# Patient Record
Sex: Female | Born: 1980 | Race: White | Hispanic: No | Marital: Married | Smoking: Current every day smoker
Health system: Southern US, Community
[De-identification: ages and names within clinical notes are randomized; demographics above are authoritative.]

## PROBLEM LIST (undated history)

## (undated) DIAGNOSIS — B9689 Other specified bacterial agents as the cause of diseases classified elsewhere: Secondary | ICD-10-CM

## (undated) DIAGNOSIS — L0291 Cutaneous abscess, unspecified: Secondary | ICD-10-CM

## (undated) DIAGNOSIS — J018 Other acute sinusitis: Secondary | ICD-10-CM

## (undated) DIAGNOSIS — A6000 Herpesviral infection of urogenital system, unspecified: Secondary | ICD-10-CM

## (undated) DIAGNOSIS — N912 Amenorrhea, unspecified: Secondary | ICD-10-CM

## (undated) DIAGNOSIS — Z13220 Encounter for screening for lipoid disorders: Secondary | ICD-10-CM

## (undated) DIAGNOSIS — K602 Anal fissure, unspecified: Secondary | ICD-10-CM

## (undated) DIAGNOSIS — J209 Acute bronchitis, unspecified: Secondary | ICD-10-CM

## (undated) DIAGNOSIS — R1310 Dysphagia, unspecified: Secondary | ICD-10-CM

## (undated) DIAGNOSIS — N760 Acute vaginitis: Secondary | ICD-10-CM

## (undated) DIAGNOSIS — F17200 Nicotine dependence, unspecified, uncomplicated: Secondary | ICD-10-CM

## (undated) DIAGNOSIS — L408 Other psoriasis: Secondary | ICD-10-CM

## (undated) DIAGNOSIS — L0390 Cellulitis, unspecified: Secondary | ICD-10-CM

## (undated) HISTORY — DX: Amenorrhea, unspecified: N91.2

## (undated) HISTORY — DX: Dysphagia, unspecified: R13.10

## (undated) HISTORY — DX: Anal fissure, unspecified: K60.2

## (undated) HISTORY — DX: Encounter for screening for lipoid disorders: Z13.220

## (undated) HISTORY — DX: Herpesviral infection of urogenital system, unspecified: A60.00

## (undated) HISTORY — DX: Other psoriasis: L40.8

## (undated) HISTORY — DX: Cutaneous abscess, unspecified: L02.91

## (undated) HISTORY — DX: Acute bronchitis, unspecified: J20.9

## (undated) HISTORY — DX: Other acute sinusitis: J01.80

## (undated) HISTORY — DX: Nicotine dependence, unspecified, uncomplicated: F17.200

## (undated) HISTORY — DX: Other specified bacterial agents as the cause of diseases classified elsewhere: B96.89

## (undated) HISTORY — DX: Cutaneous abscess, unspecified: L03.90

## (undated) HISTORY — DX: Other specified bacterial agents as the cause of diseases classified elsewhere: N76.0

---

## 2007-10-28 ENCOUNTER — Emergency Department (HOSPITAL_COMMUNITY): Admission: EM | Admit: 2007-10-28 | Discharge: 2007-10-28 | Payer: Self-pay | Admitting: Emergency Medicine

## 2008-01-01 ENCOUNTER — Emergency Department: Payer: Self-pay | Admitting: Internal Medicine

## 2009-06-05 LAB — HM PAP SMEAR

## 2009-06-05 LAB — CONVERTED CEMR LAB: Pap Smear: NORMAL

## 2009-10-24 ENCOUNTER — Ambulatory Visit: Payer: Self-pay | Admitting: Family Medicine

## 2009-10-24 DIAGNOSIS — K602 Anal fissure, unspecified: Secondary | ICD-10-CM

## 2009-10-24 DIAGNOSIS — L408 Other psoriasis: Secondary | ICD-10-CM

## 2009-11-03 ENCOUNTER — Telehealth: Payer: Self-pay | Admitting: Family Medicine

## 2009-11-18 ENCOUNTER — Ambulatory Visit: Payer: Self-pay | Admitting: Family Medicine

## 2009-11-19 LAB — CONVERTED CEMR LAB
BUN: 13 mg/dL (ref 6–23)
Chloride: 106 meq/L (ref 96–112)
Cholesterol: 157 mg/dL (ref 0–200)
Creatinine, Ser: 0.8 mg/dL (ref 0.4–1.2)
GFR calc non Af Amer: 90.26 mL/min (ref >60)
LDL Cholesterol: 77 mg/dL (ref 0–99)
Total CHOL/HDL Ratio: 2
Triglycerides: 75 mg/dL (ref 0.0–149.0)
VLDL: 15 mg/dL (ref 0.0–40.0)

## 2009-11-20 ENCOUNTER — Telehealth (INDEPENDENT_AMBULATORY_CARE_PROVIDER_SITE_OTHER): Payer: Self-pay | Admitting: *Deleted

## 2009-12-05 ENCOUNTER — Telehealth: Payer: Self-pay | Admitting: Family Medicine

## 2009-12-10 ENCOUNTER — Encounter: Payer: Self-pay | Admitting: Family Medicine

## 2009-12-10 ENCOUNTER — Ambulatory Visit: Payer: Self-pay | Admitting: Family Medicine

## 2009-12-10 LAB — CONVERTED CEMR LAB
Nitrite: POSITIVE
Protein, U semiquant: 30
Urobilinogen, UA: 0.2
pH: 6

## 2009-12-15 ENCOUNTER — Telehealth: Payer: Self-pay | Admitting: Family Medicine

## 2010-01-05 ENCOUNTER — Telehealth: Payer: Self-pay | Admitting: Family Medicine

## 2010-01-06 ENCOUNTER — Telehealth: Payer: Self-pay | Admitting: Family Medicine

## 2010-01-16 ENCOUNTER — Ambulatory Visit: Payer: Self-pay | Admitting: Family Medicine

## 2010-01-16 DIAGNOSIS — A6000 Herpesviral infection of urogenital system, unspecified: Secondary | ICD-10-CM | POA: Insufficient documentation

## 2010-01-16 DIAGNOSIS — L0390 Cellulitis, unspecified: Secondary | ICD-10-CM

## 2010-01-16 DIAGNOSIS — L0291 Cutaneous abscess, unspecified: Secondary | ICD-10-CM

## 2010-01-19 ENCOUNTER — Telehealth: Payer: Self-pay | Admitting: Family Medicine

## 2010-04-17 ENCOUNTER — Ambulatory Visit: Payer: Self-pay | Admitting: Family Medicine

## 2010-04-17 DIAGNOSIS — N912 Amenorrhea, unspecified: Secondary | ICD-10-CM

## 2010-04-17 DIAGNOSIS — R1310 Dysphagia, unspecified: Secondary | ICD-10-CM | POA: Insufficient documentation

## 2010-04-17 LAB — CONVERTED CEMR LAB: Beta hcg, urine, semiquantitative: NEGATIVE

## 2010-04-21 ENCOUNTER — Encounter: Payer: Self-pay | Admitting: Family Medicine

## 2010-04-22 ENCOUNTER — Telehealth: Payer: Self-pay | Admitting: Family Medicine

## 2010-06-11 ENCOUNTER — Ambulatory Visit: Payer: Self-pay | Admitting: Family Medicine

## 2010-06-11 DIAGNOSIS — F17200 Nicotine dependence, unspecified, uncomplicated: Secondary | ICD-10-CM

## 2010-09-04 ENCOUNTER — Ambulatory Visit: Payer: Self-pay | Admitting: Family Medicine

## 2010-09-04 LAB — CONVERTED CEMR LAB
Blood in Urine, dipstick: NEGATIVE
Ketones, urine, test strip: NEGATIVE
Nitrite: NEGATIVE
Specific Gravity, Urine: 1.01
Urobilinogen, UA: 0.2
WBC Urine, dipstick: NEGATIVE

## 2010-11-05 ENCOUNTER — Ambulatory Visit: Payer: Self-pay | Admitting: Family Medicine

## 2010-11-05 LAB — CONVERTED CEMR LAB
Bilirubin Urine: NEGATIVE
Blood in Urine, dipstick: NEGATIVE
Glucose, Urine, Semiquant: NEGATIVE
Ketones, urine, test strip: NEGATIVE
Urobilinogen, UA: 0.2
Whiff Test: NEGATIVE
pH: 5.5

## 2010-11-06 ENCOUNTER — Encounter: Payer: Self-pay | Admitting: Family Medicine

## 2010-11-27 ENCOUNTER — Ambulatory Visit: Payer: Self-pay | Admitting: Internal Medicine

## 2010-11-27 DIAGNOSIS — J209 Acute bronchitis, unspecified: Secondary | ICD-10-CM

## 2011-01-15 ENCOUNTER — Ambulatory Visit
Admission: RE | Admit: 2011-01-15 | Discharge: 2011-01-15 | Payer: Self-pay | Source: Home / Self Care | Attending: Family Medicine | Admitting: Family Medicine

## 2011-01-15 DIAGNOSIS — J018 Other acute sinusitis: Secondary | ICD-10-CM | POA: Insufficient documentation

## 2011-01-19 NOTE — Assessment & Plan Note (Signed)
Summary: STOMACH PAIN/SORE THROAT   Vital Signs:  Patient profile:   30 year old female Height:      68.5 inches Weight:      180.13 pounds BMI:     27.09 Temp:     98.3 degrees F oral Pulse rate:   84 / minute Pulse rhythm:   regular BP sitting:   156 / 70  (left arm) Cuff size:   regular  Vitals Entered By: Delilah Shan CMA Duncan Dull) (April 17, 2010 2:59 PM) CC: Difficulty swallowing   History of Present Illness: 30 yo with 5 months of difficulty swallowing foods, not liquids. No nausea or vomiting. Mild dysphagia. No chest pain.  Thought it was just a URI or allergies, but not improved with Mucinex or Claritin. Never had anything like this before.  Has had more fatige and constipation.  Also concerned that she may be pregnant.  Was supposed to start her period last week. No breast soreness. She is still taking Yaz, does not currently want to be pregnant.  Current Medications (verified): 1)  Clobetasol Propionate 0.05 % Foam (Clobetasol Propionate) .... Once Daily 2)  Mometasone Furoate 0.1 % Crea (Mometasone Furoate) .... Once Daily 3)  Yaz 3-0.02 Mg Tabs (Drospirenone-Ethinyl Estradiol) .Marland Kitchen.. 1 Tab By Mouth Daily  Allergies (verified): No Known Drug Allergies  Review of Systems      See HPI General:  Denies chills and fever. ENT:  Complains of difficulty swallowing and hoarseness. CV:  Denies chest pain or discomfort. GI:  Complains of abdominal pain, constipation, and nausea.  Physical Exam  General:  Well-developed,well-nourished,in no acute distress; alert,appropriate and cooperative throughout examination Mouth:  MMM, no obvious lesions Neck:  supple and full ROM, full non tender thyroid.   Lungs:  Normal respiratory effort, chest expands symmetrically. Lungs are clear to auscultation, no crackles or wheezes. Heart:  Normal rate and regular rhythm. S1 and S2 normal without gallop, murmur, click, rub or other extra sounds. Abdomen:  Bowel sounds  positive,abdomen soft and non-tender without masses, organomegaly or hernias noted. Psych:  Cognition and judgment appear intact. Alert and cooperative with normal attention span and concentration. No apparent delusions, illusions, hallucinations   Impression & Recommendations:  Problem # 1:  DYSPHAGIA UNSPECIFIED (ICD-787.20) Assessment New I am concerned that this has been going on for so long, which is why I will refer to ENT for further evaluation. May be reflux but cannot rule out esophageal stricture or other pathology. Also has full thyroid, will check TSH. Orders: Venipuncture ([address]) T-TSH ([id number]) Specimen Handling ([address]1478) ENT Referral (ENT)  Problem # 2:  AMENORRHEA (ICD-626.0) Assessment: New U preg neg.  Advised taking pregnancy test in another week if she has still not started her period. Her updated medication list for this problem includes:    Yaz 3-0.02 Mg Tabs (Drospirenone-ethinyl estradiol) .Marland Kitchen... 1 tab by mouth daily  Orders: Urine Pregnancy Test  ([address])  Complete Medication List: 1)  Clobetasol Propionate 0.05 % Foam (Clobetasol propionate) .... Once daily 2)  Mometasone Furoate 0.1 % Crea (Mometasone furoate) .... Once daily 3)  Yaz 3-0.02 Mg Tabs (Drospirenone-ethinyl estradiol) .Marland Kitchen.. 1 tab by mouth daily  Patient Instructions: 1)  Please stop by to see Aram Beecham on the way out. 2)  Astepro- one sray in each nostril twice daily.  Current Allergies (reviewed today): No known allergies   Laboratory Results   Urine Tests   Date/Time Reported: April 17, 2010 3:20 PM     Urine HCG: negative

## 2011-01-19 NOTE — Assessment & Plan Note (Signed)
Summary: 1 MONTH F/U AFTER STARTING WELLBUTRIN/NT   Vital Signs:  Patient profile:   30 year old female Weight:      182 pounds Temp:     98.6 degrees F oral Pulse rate:   72 / minute Pulse rhythm:   regular BP sitting:   138 / 70  (left arm) Cuff size:   regular  Vitals Entered By: Lowella Petties CMA (June 11, 2010 8:56 AM) CC: 1 month follow up   History of Present Illness: 30 yo here for 1 month follow up.  1.  Smoking cessation- started Zyban last month but [place] has not starting taking it yet.  She did fill her prescription but feels she is no 100% ready to quit smoking yet.  she is working on it and plans to restart it in July.  2.  Vaginal discharge with h/o recurrent BV- mild odor, not itchy, not as irritated as she has been in past. No pain with intercourse or dysuria.  No fever or back pain.  Current Medications (verified): 1)  Clobetasol Propionate 0.05 % Foam (Clobetasol Propionate) .... Once Daily 2)  Mometasone Furoate 0.1 % Crea (Mometasone Furoate) .... Once Daily 3)  Budeprion Sr 150 Mg Tb12 (Bupropion Hcl) .Marland Kitchen.. 1 Tab Po Daily X 3 Days, Then 1 By Mouth 2 Times Daily  Allergies (verified): No Known Drug Allergies  Past History:  Past Medical History: Last updated: 10/24/2009 Psorasis recurrent BV Genital herpes  Family History: Last updated: 10/24/2009 Father- blood cancer, HTN, DM Mom- HTN Grandfather- colon CA in 43s  Social History: Last updated: 10/24/2009 Relocated from [place] 3 years ago.  Married in 09/2009.  LIves in [place].  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   Current Smoker Alcohol use-yes Drug use-no Regular exercise-no  Risk Factors: Exercise: no (10/24/2009)  Risk Factors: Smoking Status: current (10/24/2009)  Review of Systems      See HPI General:  Denies fever. GI:  Denies abdominal pain, nausea, and vomiting. GU:  Complains of discharge; denies dysuria, incontinence, and urinary frequency.  Physical  Exam  General:  Well-developed,well-nourished,in no acute distress; alert,appropriate and cooperative throughout examination Genitalia:  normal introitus, no external lesions, and vaginal discharge.   no foul odor, no CMT. Extremities:  No clubbing, cyanosis, edema, or deformity noted with normal full range of motion of all joints.   Neurologic:  alert & oriented X3.   Skin:  Intact without suspicious lesions or rashes Psych:  Cognition and judgment appear intact. Alert and cooperative with normal attention span and concentration. No apparent delusions, illusions, hallucinations   Impression & Recommendations:  Problem # 1:  VAGINAL DISCHARGE (ICD-623.5) Assessment New Likely physiological discharge. Wet prep neg for BV or yeast. Advised continuing with current precautions- using unscented soaps as detergents as her frequency of BV has tremendously improved over past several months. Orders: Wet Prep ([id number])  Problem # 2:  TOBACCO ABUSE (ICD-305.1) Assessment: Unchanged Pt will keep her Zyban prescription and start taking it next week.  Complete Medication List: 1)  Clobetasol Propionate 0.05 % Foam (Clobetasol propionate) .... Once daily 2)  Mometasone Furoate 0.1 % Crea (Mometasone furoate) .... Once daily 3)  Budeprion Sr 150 Mg Tb12 (Bupropion hcl) .Marland Kitchen.. 1 tab po daily x 3 days, then 1 by mouth 2 times daily  Prior Medications (reviewed today): CLOBETASOL PROPIONATE 0.05 % FOAM (CLOBETASOL PROPIONATE) once daily MOMETASONE FUROATE 0.1 % CREA (MOMETASONE FUROATE) once daily BUDEPRION SR 150 MG TB12 (BUPROPION HCL) 1 tab  po daily x 3 days, then 1 by mouth 2 times daily Current Allergies (reviewed today): No known allergies   Laboratory Results    Wet [place]/KOH Source: vaginal WBC/hpf 1-5 Bacteria/hpf 1+ Clue cells/hpf none  Negative whiff Yeast/hpf none KOH Negative Trichomonas/hpf none

## 2011-01-19 NOTE — Assessment & Plan Note (Signed)
Summary: BRONCHITIS???   Vital Signs:  Patient profile:   30 year old female Height:      68.5 inches Weight:      190.25 pounds BMI:     28.61 Temp:     99.1 degrees F oral Pulse rate:   70 / minute Pulse rhythm:   regular BP sitting:   140 / 80  (left arm) Cuff size:   regular  Vitals Entered ByMelody Comas (November 27, 2010 2:12 PM) CC: possible bronchitis   History of Present Illness: CC: bronchitis?  2+wk h/o cold.  Started with RN and ST with nasal congestion, chest congestion.  Today pt with cough that started worsening, feels gland swollen in neck and feels mufled ears.  Feels sputum stuck in chest.  Sleeping ok at night.  Taking nyquil and dayquil, robitussin, theraflu, mucinex.  Possible subjective fever.  + constipated. + body ache, and tired.  husband dx with acute bronchitis.  No abd pain, n/v/d, rashes, myalgias, arthralgias.  + h/o bronchiolitis per patient at age 61 but no problems recently.  Smoking 1/2 ppd.  saw derm for psoriasis, prescribed creams that worked well for her, asks about refills here.  not on wellbutrin, would like to try chantix again as did well with this in past.  Current Medications (verified): 1)  Clobetasol Propionate 0.05 % Foam (Clobetasol Propionate) .... Once Daily 2)  Mometasone Furoate 0.1 % Crea (Mometasone Furoate) .... Once Daily 3)  Budeprion Sr 150 Mg Tb12 (Bupropion Hcl) .Marland Kitchen.. 1 Tab Po Daily X 3 Days, Then 1 By Mouth 2 Times Daily 4)  Valacyclovir Hcl 500 Mg Tabs (Valacyclovir Hcl) .... Take One Tablet By Mouth Twice A Day For Three Days 5)  Diflucan 150 Mg Tabs (Fluconazole) .... Once Daily  Allergies (verified): No Known Drug Allergies  Past History:  Past Medical History: Last updated: 10/24/2009 Psorasis recurrent BV Genital herpes  Social History: Last updated: 10/24/2009 Relocated from [place] 3 years ago.  Married in 09/2009.  LIves in [place].  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   Current  Smoker Alcohol use-yes Drug use-no Regular exercise-no  Review of Systems       per HPI  Physical Exam  General:  Well-developed,well-nourished,in no acute distress; alert,appropriate and cooperative throughout examination Head:  sinuses NT Eyes:  No corneal or conjunctival inflammation noted. EOMI. Perrla.  Ears:  TMs retracted bilaterally. Nose:  mucosal erythema and mucosal edema.   Mouth:  MMM, no obvious lesions Neck:  supple and full ROM, full non tender thyroid.  no LAD Lungs:  Normal respiratory effort, chest expands symmetrically. Lungs are clear to auscultation, no crackles or wheezes. Heart:  Normal rate and regular rhythm. S1 and S2 normal without gallop, murmur, click, rub or other extra sounds. Pulses:  2+ rad pulses Extremities:  No clubbing, cyanosis, edema, or deformity noted with normal full range of motion of all joints.     Impression & Recommendations:  Problem # 1:  BRONCHITIS, ACUTE (ICD-466.0) supportive care + zpack, cheratussin.  red flags to return discussed. Her updated medication list for this problem includes:    Zithromax Z-pak 250 Mg Tabs (Azithromycin) ..... Use as directed    Cheratussin Ac 100-10 Mg/31ml Syrp (Guaifenesin-codeine) ..... One teaspoon at bedtime as needed cough  Problem # 2:  PSORIASIS (ICD-696.1) refilled shampoo/lotion.  advised to f/u with PCP on this  Complete Medication List: 1)  Clobex 0.05 % Sham (Clobetasol propionate) .... Use as directed 2)  Clobetasol Propionate 0.05 % Soln (Clobetasol propionate) .... Apply to scalp twice daily as needed for itching/irritation 3)  Mometasone Furoate 0.1 % Crea (Mometasone furoate) .... Once daily 4)  Budeprion Sr 150 Mg Tb12 (Bupropion hcl) .Marland Kitchen.. 1 tab po daily x 3 days, then 1 by mouth 2 times daily not taking 5)  Valacyclovir Hcl 500 Mg Tabs (Valacyclovir hcl) .... Take one tablet by mouth twice a day for three days 6)  Fluconazole 150 Mg Tabs (Fluconazole) .... One x 1 7)   Zithromax Z-pak 250 Mg Tabs (Azithromycin) .... Use as directed 8)  Cheratussin Ac 100-10 Mg/59ml Syrp (Guaifenesin-codeine) .... One teaspoon at bedtime as needed cough  Patient Instructions: 1)  Sounds like you have a bronchitis. 2)  Use medication as prescribed: zpack.  cheratussin for cough at night. 3)  Push fluids, cut back smoking, plenty of rest. 4)  Please return if you are not improving as expected, or if you have high fevers (>101.5) or difficulty swallowing. 5)  Call clinic with questions.  Pleasure to see you today!  Prescriptions: CHERATUSSIN AC 100-10 MG/5ML SYRP (GUAIFENESIN-CODEINE) one teaspoon at bedtime as needed cough  #100cc x 00   Entered and Authorized by:   Eustaquio Boyden  MD   Signed by:   Eustaquio Boyden  MD on 11/27/2010   Method used:   Print then Give to Patient   RxID:   [id number] Z-PAK 250 MG TABS (AZITHROMYCIN) use as directed  #1 x 0   Entered and Authorized by:   Eustaquio Boyden  MD   Signed by:   Eustaquio Boyden  MD on 11/27/2010   Method used:   [id number]       [address]       [place], [place]  [address]       Ph: [phone] or [phone]       Fax: [phone]   RxID:   [id number] FLUCONAZOLE 150 MG TABS (FLUCONAZOLE) one x 1  #1 x 0   Entered and Authorized by:   Eustaquio Boyden  MD   Signed by:   Eustaquio Boyden  MD on 11/27/2010   Method used:   [facility]        [facility] [id number]       [address]       [place], [place]  [address]       Ph: [phone] or [phone]       Fax: [phone]   RxID:   [id number] CLOBETASOL PROPIONATE 0.05 % SOLN (CLOBETASOL PROPIONATE) apply to scalp twice daily as needed for itching/irritation  #50 x 0   Entered and Authorized by:   Eustaquio Boyden  MD   Signed by:   Eustaquio Boyden  MD on 11/27/2010   Method used:   [facility]        [facility] [id number]       [address]       [place], [place]  [address]       Ph: [phone] or [phone]       Fax: [phone]   RxID:   [id number] CLOBEX 0.05 % SHAM (CLOBETASOL PROPIONATE) use as directed  #135mL x 0   Entered and Authorized by:   Eustaquio Boyden  MD   Signed by:   Eustaquio Boyden  MD on 11/27/2010   Method used:   [facility]        [id number]       [address]       [place], [place]  [address]       Ph: [phone] or [phone]       Fax: [phone]   RxID:   [id number]    Orders Added: 1)  Est. Patient Level III [[address]]    Current Allergies (reviewed today): No known allergies

## 2011-01-19 NOTE — Progress Notes (Signed)
Summary: Wants to stop smoking  Phone Note Call from Patient Call back at Home Phone [phone]   Caller: Patient Call For: Ruthe Mannan MD Summary of Call: Patient request that Dr. Dayton Martes give her a Rx for something to help her quit smoking.  She has taken Chantix in the past but it gave her nightmares.  Is there anything else that you recommend she take?  If not she is willing to go back on Chantix.  Please advise.   Initial call taken by: Linde Gillis CMA Duncan Dull),  Apr 22, 2010 4:40 PM  Follow-up for Phone Call        I cannot find what I gave to her.  Was it Zyban or WEllbutrin? Ruthe Mannan MD  Apr 23, 2010 7:36 AM  Left message on cell phone voicemail for patient to return call.  Linde Gillis CMA Duncan Dull)  Apr 23, 2010 8:00 AM   Spoke with patient and she says that she has never tried Zyban or Wellbutrin for smoking cessation.  She would like to try Wellbutrin as long as there is a generic for it.  Please advise.  Linde Gillis CMA Duncan Dull)  Apr 23, 2010 11:57 AM   Additional Follow-up for Phone Call Additional follow up Details #1::        Ok, will send in Zyban.  Needs to come see me in 1 month.  Please make sure she has never had seizures.  Thank you. Ruthe Mannan MD  Apr 23, 2010 12:01 PM     New/Updated Medications: BUDEPRION SR 150 MG TB12 (BUPROPION HCL) 1 tab po daily x 3 days, then 1 by mouth 2 times daily Prescriptions: BUDEPRION SR 150 MG TB12 (BUPROPION HCL) 1 tab po daily x 3 days, then 1 by mouth 2 times daily  #180 x 0   Entered and Authorized by:   Ruthe Mannan MD   Signed by:   Ruthe Mannan MD on 04/23/2010   Method used:   [facility]        [facility] [address]       [address]       [place], [place]  [address]       Ph: [phone] or [phone]       Fax: [phone]   RxID:   [id number]   Appended Document: Wants to stop smoking Spoke with patient and advised her as instructed.  She has never had a seizure.  One month follow up  appt made for 05/26/2010 at 12:15.

## 2011-01-19 NOTE — Assessment & Plan Note (Signed)
Summary: COUGH,CONGESTION/CLE   Vital Signs:  Patient profile:   30 year old female Height:      68.5 inches Weight:      181 pounds BMI:     27.22 Temp:     98.5 degrees F oral Pulse rate:   84 / minute Pulse rhythm:   regular BP sitting:   122 / 62  (left arm) Cuff size:   regular  Vitals Entered By: Delilah Shan CMA Duncan Dull) (January 16, 2010 3:13 PM) CC: Cough, congestion   History of Present Illness: 30 yo here with multiple complaints.  1. Genital herpes- concerned she may have another outbreak.  Has strange sensation on outer labia, but cannot see any lesions.  Valtrex too expensive, wants something generic.  2.  Abcess- has had inguninal/pubic abscess in past, resolved with course of antibotic, now has another one.  Started last week, now much more pain and red,  Warm to touch.  No fevers, chills, n/v.  3.  URI- has had cough and congestion for 3 weeks.  Does not have money to come in to see me, so was just hoping it was viral.  Feels a lot of sinus pressure and ear pain.  No fevers or chills.  No cough, wheezing or SOB.  Husband has similar symptoms.  Taking Mucinex OTC.  Current Medications (verified): 1)  Clobetasol Propionate 0.05 % Foam (Clobetasol Propionate) .... Once Daily 2)  Mometasone Furoate 0.1 % Crea (Mometasone Furoate) .... Once Daily 3)  Yaz 3-0.02 Mg Tabs (Drospirenone-Ethinyl Estradiol) .Marland Kitchen.. 1 Tab By Mouth Daily 4)  Doxycycline Hyclate 100 Mg Caps (Doxycycline Hyclate) .... Take 1 Tab Twice A Day X 10 Days 5)  Diflucan 150 Mg Tabs (Fluconazole) .... Once Daily 6)  Valacyclovir Hcl 500 Mg Tabs (Valacyclovir Hcl) .Marland Kitchen.. 1 Tab By Mouth Two Times A Day X 3 Days  Allergies (verified): No Known Drug Allergies  Review of Systems      See HPI General:  Denies chills and fever. CV:  Denies chest pain or discomfort. Resp:  Complains of cough; denies shortness of breath, sputum productive, and wheezing. GI:  Denies abdominal pain, diarrhea, nausea, and  vomiting. GU:  Denies discharge, dysuria, and genital sores. Psych:  Denies anxiety and depression.  Physical Exam  General:  Well-developed,well-nourished,in no acute distress; alert,appropriate and cooperative throughout examination Eyes:  No corneal or conjunctival inflammation noted. EOMI. Perrla. Funduscopic exam benign, without hemorrhages, exudates or papilledema. Vision grossly normal. Ears:  TMs retracted bilaterally. Nose:  mucosal erythema and mucosal edema.   neg sinuses Mouth:  MMM Lungs:  Normal respiratory effort, chest expands symmetrically. Lungs are clear to auscultation, no crackles or wheezes. Heart:  Normal rate and regular rhythm. S1 and S2 normal without gallop, murmur, click, rub or other extra sounds. Genitalia:  Pelvic Exam:        External: normal female genitalia without lesions or masses        Vagina: normal without lesions or masses        Cervix: normal without lesions or masses        Adnexa: normal bimanual exam without masses or fullness        Uterus: normal by palpation        Pap smear: performed Skin:  3 cm non fluctuent abscess right inguinal area with surrounding warmth and erythema. Inguinal Nodes:  No significant adenopathy Psych:  normally interactive, good eye contact, and not anxious appearing.     Impression & Recommendations:  Problem # 1:  CELLULITIS AND ABSCESS OF UNSPECIFIED SITE (ICD-682.9) Assessment New Nonfluctuant.  Treat with 10 day course of Doxycyline, continue warm compresses.  Red flag symptoms given for immediate follow up. The following medications were removed from the medication list:    Cipro 500 Mg Tabs (Ciprofloxacin hcl) .Marland Kitchen... 1 tab by mouth two times a day x 14 days. Her updated medication list for this problem includes:    Doxycycline Hyclate 100 Mg Caps (Doxycycline hyclate) .Marland Kitchen... Take 1 tab twice a day x 10 days  Problem # 2:  URI (ICD-465.9) Assessment: New Continue supportive care.  Will be on doxy for #1  so would provide good coverage for bacterial sinusitis as well.  Problem # 3:  GENITAL HERPES (ICD-054.10) Assessment: New CAlled in generic Valacyclovir.  No lesions visualized on exam.   Complete Medication List: 1)  Clobetasol Propionate 0.05 % Foam (Clobetasol propionate) .... Once daily 2)  Mometasone Furoate 0.1 % Crea (Mometasone furoate) .... Once daily 3)  Yaz 3-0.02 Mg Tabs (Drospirenone-ethinyl estradiol) .Marland Kitchen.. 1 tab by mouth daily 4)  Doxycycline Hyclate 100 Mg Caps (Doxycycline hyclate) .... Take 1 tab twice a day x 10 days 5)  Diflucan 150 Mg Tabs (Fluconazole) .... Once daily 6)  Valacyclovir Hcl 500 Mg Tabs (Valacyclovir hcl) .Marland Kitchen.. 1 tab by mouth two times a day x 3 days Prescriptions: VALACYCLOVIR HCL 500 MG TABS (VALACYCLOVIR HCL) 1 tab by mouth two times a day x 3 days  #20 x 0   Entered and Authorized by:   Ruthe Mannan MD   Signed by:   Ruthe Mannan MD on 01/16/2010   Method used:   [facility]        [facility]       [address]       [place], [place]  [address]       Ph: [phone] or [phone]       Fax: [phone]   RxID:   [id number] DIFLUCAN 150 MG TABS (FLUCONAZOLE) once daily  #1 x 0   Entered and Authorized by:   Ruthe Mannan MD   Signed by:   Ruthe Mannan MD on 01/16/2010   Method used:   [facility]        [facility] ([facility]ail)       [address]       [place], [place]  [address]       Ph: [phone] or [phone]       Fax: [phone]   RxID:   [id number] DOXYCYCLINE HYCLATE 100 MG CAPS (DOXYCYCLINE HYCLATE) Take 1 tab twice a day x 10 days  #20 x 0   Entered and Authorized by:   Ruthe Mannan MD   Signed by:   Ruthe Mannan MD on 01/16/2010   Method used:   [facility]        [facility]retail)       [address]       [place], [place]  [address]       Ph: [phone] or [phone]       Fax: [phone]   RxID:   [id number]   Current  Allergies (reviewed today): No known allergies

## 2011-01-19 NOTE — Consult Note (Signed)
Summary: [facility]  [facility]   Imported By: Lanelle Bal 05/07/2010 10:22:49  _____________________________________________________________________  External Attachment:    Type:   Image     Comment:   External Document

## 2011-01-19 NOTE — Progress Notes (Signed)
Summary: Vaginal discharge  Phone Note Call from Patient Call back at [phone]   Caller: Patient Call For: Ruthe Mannan MD Summary of Call: Pt saw Dr Dayton Martes on 12/10/09 and was given Cipro and Diflucan. Pt finished Cipro and took Diflucan. One week later after finishing Cipro and Diflucan pt noticed vaginal discharge. Pt had Metrogel at home so pt used that for 6 days. After finished Metro gel pt noticed discharge got thicker and seemed cheesy. Pt wonders if she needs to come back in or have something called in. Pt wants to wait until 01/07/10 and send this note to Dr. Dayton Martes. Pt can be reached at [phone]. [facility]. Please advise.  Initial call taken by: Lewanda Rife LPN,  January 06, 2010 4:26 PM  Follow-up for Phone Call        Since I know she is sexually active with husband only and money is tight for her, we can try one more dose of diflucan.  If discharge persists, come in for appt. Follow-up by: Ruthe Mannan MD,  January 06, 2010 8:24 PM    Prescriptions: DIFLUCAN 150 MG TABS (FLUCONAZOLE) once daily  #1 x 0   Entered and Authorized by:   Ruthe Mannan MD   Signed by:   Ruthe Mannan MD on 01/06/2010   Method used:   [facility]        [facility] (retail)       [address]       [place], [place]  [address]       Ph: [phone] or [phone]       Fax: [phone]   RxID:   [id number]   Appended Document: Vaginal discharge Patient Advised.

## 2011-01-19 NOTE — Progress Notes (Signed)
Summary: Abdominal pain on and off  Phone Note Call from Patient Call back at [phone]   Caller: Patient Call For: Ruthe Mannan MD Summary of Call: Saw Dr Dayton Martes on 01/16/10. Pt got Doxycycline rx and started on 01/18/10. This Am pt took med when got up and 45 mins later had pain in upper abdomen (Pain scale was 8). Then pt ate some food. Abd pain eased off. Pain was on and off most of the day.  Pt said since lunch no pain more of a feeling of tension in stomach.  Should pt continue taking the Doxycycline or not? [facility] uses [phone] if needed. Please advise. Pt understands due to lateness of day may hear today or possibly tomorrow. Pt said that was OK. Initial call taken by: Lewanda Rife LPN,  January 19, 2010 4:56 PM  Follow-up for Phone Call        It's hard to know if that was a reaction to doxycycline.  Has she taken another dose since then?  Is she still having pain? Follow-up by: Ruthe Mannan MD,  January 19, 2010 6:37 PM  Additional Follow-up for Phone Call Additional follow up Details #1::        Left message on voicemail  in detail.  Personalized VM. Lugene Fuquay CMA (AAMA)  January 20, 2010 10:37 AM  Left message on voicemail  to return call.  Additional Follow-up by: Delilah Shan CMA Duncan Dull),  January 21, 2010 8:58 AM    Additional Follow-up for Phone Call Additional follow up Details #2::    OPt. has not taken any more Doxycycline today.  She took it yesterday but experienced severe pain again.  She says the directions say to take it on an empty stomach but because she was having so much pain with it, she has even been taking it with food but no dairy products. Follow-up by: Delilah Shan CMA Duncan Dull),  January 21, 2010 11:46 AM  Additional Follow-up for Phone Call Additional follow up Details #3:: Details for Additional Follow-up Action Taken: [facility], stop doxy.  We can try keflex. Additional Follow-up by: Ruthe Mannan MD,  January 21, 2010 11:50 AM  New/Updated  Medications: KEFLEX 500 MG CAPS (CEPHALEXIN) 1 tab by mouth every six hours x 7 days. Prescriptions: KEFLEX 500 MG CAPS (CEPHALEXIN) 1 tab by mouth every six hours x 7 days.  #28 x 0   Entered and Authorized by:   Ruthe Mannan MD   Signed by:   Ruthe Mannan MD on 01/21/2010   Method used:   [facility]ically to        [facility] (retail)       [address]       [place], [place]  [address]       Ph: [phone] or [phone]       Fax: [phone]   RxID:   [id number]  Left message on voicemail  in detail.  Personalized VM. Lugene Fuquay CMA Duncan Dull)  January 21, 2010 12:37 PM

## 2011-01-19 NOTE — Assessment & Plan Note (Signed)
Summary: discomfort and discharge/dlo   Vital Signs:  Patient profile:   30 year old female Height:      68.5 inches Weight:      185 pounds BMI:     27.82 Temp:     98.3 degrees F oral Pulse rate:   72 / minute Pulse rhythm:   regular BP sitting:   120 / 70  (left arm) Cuff size:   regular  Vitals Entered By: Linde Gillis CMA Duncan Dull) (November 05, 2010 12:03 PM) CC: vagianl discomfort and discharge   History of Present Illness: 30 yo here for labial discomfort and discharge.    Has h/o genital herpes and recurrent BV.    Last week, noticed increased, white vaginal discharge. Also had a few herpetic lesions last week, took Valtrex and lesions resolved.  Pain is improving.    Sometimes some discomfort with urination. No fevers, chill, abdominal pain, back pain, nausea or vomiting. No increased frequency.    Current Medications (verified): 1)  Clobetasol Propionate 0.05 % Foam (Clobetasol Propionate) .... Once Daily 2)  Mometasone Furoate 0.1 % Crea (Mometasone Furoate) .... Once Daily 3)  Budeprion Sr 150 Mg Tb12 (Bupropion Hcl) .Marland Kitchen.. 1 Tab Po Daily X 3 Days, Then 1 By Mouth 2 Times Daily 4)  Valacyclovir Hcl 500 Mg Tabs (Valacyclovir Hcl) .... Take One Tablet By Mouth Twice A Day For Three Days 5)  Diflucan 150 Mg Tabs (Fluconazole) .... Once Daily  Allergies (verified): No Known Drug Allergies  Review of Systems      See HPI General:  Denies fever. GU:  Complains of discharge and dysuria; denies incontinence, nocturia, urinary frequency, and urinary hesitancy.   Impression & Recommendations:  Problem # 1:  VAGINAL DISCHARGE (ICD-623.5) Assessment New Wet prep consistent with yeast, treat with Diflucan 150 mg by mouth x 1. Orders: Wet Prep (16109UE) UA Dipstick w/o Micro (manual) (45409)  Problem # 2:  GENITAL HERPES (ICD-054.10) Assessment: Improved improved s/p Valtrex.  Problem # 3:  DYSURIA (ICD-788.1) Assessment: New only trace LE otherwise negative  UA. Will send for cultlure to r/o infection. Orders: T-Culture, Urine ([id number])  Complete Medication List: 1)  Clobetasol Propionate 0.05 % Foam (Clobetasol propionate) .... Once daily 2)  Mometasone Furoate 0.1 % Crea (Mometasone furoate) .... Once daily 3)  Budeprion Sr 150 Mg Tb12 (Bupropion hcl) .Marland Kitchen.. 1 tab po daily x 3 days, then 1 by mouth 2 times daily 4)  Valacyclovir Hcl 500 Mg Tabs (Valacyclovir hcl) .... Take one tablet by mouth twice a day for three days 5)  Diflucan 150 Mg Tabs (Fluconazole) .... Once daily Prescriptions: DIFLUCAN 150 MG TABS (FLUCONAZOLE) once daily  #1 x 0   Entered and Authorized by:   Ruthe Mannan MD   Signed by:   Ruthe Mannan MD on 11/05/2010   Method used:   [facility] to        [facility]  [place] [id number] (retail)       [address]       [place], [place]  [address]       Ph: [phone] or [phone]       Fax: [phone]   RxID:   [id number]    Orders Added: 1)  Wet Prep [32951OA] 2)  UA Dipstick w/o Micro (manual) [81002] 3)  T-Culture, Urine [[id number]] 4)  Est. Patient Level IV [01093]    Current Allergies (reviewed today): No known allergies   Laboratory Results   Urine Tests  Date/Time Received: November 05, 2010 12:24 PM   Routine Urinalysis   Color: yellow Appearance: Cloudy Glucose: negative   (Normal Range: Negative) Bilirubin: negative   (Normal Range: Negative) Ketone: negative   (Normal Range: Negative) Spec. Gravity: 1.020   (Normal Range: 1.003-1.035) Blood: negative   (Normal Range: Negative) pH: 5.5   (Normal Range: 5.0-8.0) Protein: trace   (Normal Range: Negative) Urobilinogen: 0.2   (Normal Range: 0-1) Nitrite: negative   (Normal Range: Negative) Leukocyte Esterace: trace   (Normal Range: Negative)      Wet Mount Source: vaginal WBC/hpf: 1-5 Bacteria/hpf: rare Clue cells/hpf: none  Negative whiff Yeast/hpf: moderate Trichomonas/hpf: none    Laboratory Results   Urine  Tests    Routine Urinalysis   Color: yellow Appearance: Cloudy Glucose: negative   (Normal Range: Negative) Bilirubin: negative   (Normal Range: Negative) Ketone: negative   (Normal Range: Negative) Spec. Gravity: 1.020   (Normal Range: 1.003-1.035) Blood: negative   (Normal Range: Negative) pH: 5.5   (Normal Range: 5.0-8.0) Protein: trace   (Normal Range: Negative) Urobilinogen: 0.2   (Normal Range: 0-1) Nitrite: negative   (Normal Range: Negative) Leukocyte Esterace: trace   (Normal Range: Negative)      Wet Mount/KOH  Negative whiff   Appended Document: discomfort and discharge/dlo

## 2011-01-19 NOTE — Assessment & Plan Note (Signed)
Summary: PAINS IN PRIVATE AREA/RBH   Vital Signs:  Patient profile:   30 year old female Height:      68.5 inches Weight:      172 pounds BMI:     25.87 Temp:     98.4 degrees F oral Pulse rate:   72 / minute Pulse rhythm:   regular BP sitting:   122 / 80  (left arm) Cuff size:   regular  Vitals Entered By: Linde Gillis CMA Duncan Dull) (September 04, 2010 2:37 PM) CC: pain in private area   History of Present Illness: 30 yo here for labial discomfort. Has h/o genital herpes and recurrent BV.  Has not noticed any increased or change in discharge.  No genital lesions but her labia have been mores sensitive.  Sometimes some discomfort with urination. No fevers, chill, abdominal pain, back pain, nausea or vomiting.  Taking her Valtrex and now pain has improved.  Current Medications (verified): 1)  Clobetasol Propionate 0.05 % Foam (Clobetasol Propionate) .... Once Daily 2)  Mometasone Furoate 0.1 % Crea (Mometasone Furoate) .... Once Daily 3)  Budeprion Sr 150 Mg Tb12 (Bupropion Hcl) .Marland Kitchen.. 1 Tab Po Daily X 3 Days, Then 1 By Mouth 2 Times Daily 4)  Valacyclovir Hcl 500 Mg Tabs (Valacyclovir Hcl) .... Take One Tablet By Mouth Twice A Day For Three Days  Allergies (verified): No Known Drug Allergies  Past History:  Past Medical History: Last updated: 10/24/2009 Psorasis recurrent BV Genital herpes  Family History: Last updated: 10/24/2009 Father- blood cancer, HTN, DM Mom- HTN Grandfather- colon CA in 50s  Social History: Last updated: 10/24/2009 Relocated from [place] 3 years ago.  Married in 09/2009.  LIves in [place].  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   Current Smoker Alcohol use-yes Drug use-no Regular exercise-no  Risk Factors: Exercise: no (10/24/2009)  Risk Factors: Smoking Status: current (10/24/2009)  Review of Systems      See HPI General:  Denies chills and fever. GI:  Denies diarrhea, nausea, and vomiting. GU:  Complains of dysuria; denies  hematuria, incontinence, urinary frequency, and urinary hesitancy.  Physical Exam  General:  Well-developed,well-nourished,in no acute distress; alert,appropriate and cooperative throughout examination Abdomen:  Bowel sounds positive,abdomen soft and non-tender without masses, organomegaly or hernias noted. NO CVA or suprapubic tenderness. Genitalia:  normal introitus, no external lesions, no vaginal discharge. no foul odor, no CMT. Psych:  Cognition and judgment appear intact. Alert and cooperative with normal attention span and concentration. No apparent delusions, illusions, hallucinations   Impression & Recommendations:  Problem # 1:  DYSURIA (ICD-788.1) Assessment New UA negative, send for culture to verify. Orders: UA Dipstick w/o Micro (manual) (16109) T-Culture, Urine ([id number])  Problem # 2:  GENITAL HERPES (ICD-054.10) Assessment: Deteriorated Advised genital pain is likely due to herpes outbreak, continue Valtrex as prescribed.  Complete Medication List: 1)  Clobetasol Propionate 0.05 % Foam (Clobetasol propionate) .... Once daily 2)  Mometasone Furoate 0.1 % Crea (Mometasone furoate) .... Once daily 3)  Budeprion Sr 150 Mg Tb12 (Bupropion hcl) .Marland Kitchen.. 1 tab po daily x 3 days, then 1 by mouth 2 times daily 4)  Valacyclovir Hcl 500 Mg Tabs (Valacyclovir hcl) .... Take one tablet by mouth twice a day for three days  Other Orders: Urine Pregnancy Test  (91478) Wet Prep (29562ZH)  Current Allergies (reviewed today): No known allergies   Laboratory Results   Urine Tests  Date/Time Received: September 04, 2010 2:59 PM   Routine Urinalysis   Color:  yellow Appearance: Cloudy Glucose: negative   (Normal Range: Negative) Bilirubin: negative   (Normal Range: Negative) Ketone: negative   (Normal Range: Negative) Spec. Gravity: 1.010   (Normal Range: 1.003-1.035) Blood: negative   (Normal Range: Negative) pH: 6.0   (Normal Range: 5.0-8.0) Protein: trace   (Normal  Range: Negative) Urobilinogen: 0.2   (Normal Range: 0-1) Nitrite: negative   (Normal Range: Negative) Leukocyte Esterace: negative   (Normal Range: Negative)    Urine HCG: negative

## 2011-01-19 NOTE — Progress Notes (Signed)
Summary: Yaz  Phone Note Refill Request Message from:  Fax from Pharmacy on January 05, 2010 11:54 AM  Refills Requested: Medication #1:  YAZ 3-0.02 MG TABS 1 tab by mouth daily [facility] [place]  Phone:   [phone]   Method Requested: Electronic Initial call taken by: Delilah Shan CMA (AAMA),  January 05, 2010 11:55 AM    Prescriptions: YAZ 3-0.02 MG TABS (DROSPIRENONE-ETHINYL ESTRADIOL) 1 tab by mouth daily  #1 x 11   Entered and Authorized by:   Ruthe Mannan MD   Signed by:   Ruthe Mannan MD on 01/05/2010   Method used:   Electronically to        [facility]  [place] (retail)       [address]       [place], [place]  [address]       Ph: [phone] or [phone]       Fax: [phone]   RxID:   [id number]

## 2011-01-21 NOTE — Assessment & Plan Note (Signed)
Summary: MIGRAINE HA,STOMACH/CLE   Vital Signs:  Patient profile:   30 year old female Height:      68.5 inches Weight:      193.50 pounds BMI:     29.10 Temp:     98.2 degrees F oral Pulse rate:   85 / minute Pulse rhythm:   regular BP sitting:   122 / 82  (left arm) Cuff size:   regular  Vitals Entered By: Linde Gillis CMA Duncan Dull) (January 15, 2011 11:38 AM) CC: headache, sinus congestion.   History of Present Illness: 30 yo here for headache, sinus congestion.  Treated for bronchitis on 11/27/2010 with Zpack. Wheezing has stopped but now has frontal sinus pressure and feeling like mucous is stuck in her throat. Stomach has also been a little upset- more burping, gasy.  No photophobia, nausea or vomiting associated with HA. Tylenol alleviates that pressure for several hours at a time.    Knows smoking is making her symptoms worse, ready to try Chantix again. Worked for her last time she quit smoking.  Current Medications (verified): 1)  Clobex 0.05 % Sham (Clobetasol Propionate) .... Use As Directed 2)  Clobetasol Propionate 0.05 % Soln (Clobetasol Propionate) .... Apply To Scalp Twice Daily As Needed For Itching/irritation 3)  Mometasone Furoate 0.1 % Crea (Mometasone Furoate) .... Once Daily 4)  Budeprion Sr 150 Mg Tb12 (Bupropion Hcl) .Marland Kitchen.. 1 Tab Po Daily X 3 Days, Then 1 By Mouth 2 Times Daily Not Taking 5)  Valacyclovir Hcl 500 Mg Tabs (Valacyclovir Hcl) .... Take One Tablet By Mouth Twice A Day For Three Days 6)  Fluconazole 150 Mg Tabs (Fluconazole) .... One X 1 7)  Amoxicillin 500 Mg Tabs (Amoxicillin) .Marland Kitchen.. 1 Tab By Mouth Two Times A Day X 10 Days 8)  Chantix Starting Month Pak 0.5 Mg X 11 & 1 Mg X 42 Tabs (Varenicline Tartrate) .... Use As Directed.  Allergies (verified): No Known Drug Allergies  Review of Systems      See HPI General:  Denies fever. ENT:  Complains of postnasal drainage, sinus pressure, and sore throat. Resp:  Denies cough, sputum productive,  and wheezing.  Physical Exam  General:  Well-developed,well-nourished,in no acute distress; alert,appropriate and cooperative throughout examination Nose:  mucosal erythema and mucosal edema.  right>left sinuses TTP throughout Mouth:  MMM, no obvious lesions Lungs:  Normal respiratory effort, chest expands symmetrically. Lungs are clear to auscultation, no crackles or wheezes. Heart:  Normal rate and regular rhythm. S1 and S2 normal without gallop, murmur, click, rub or other extra sounds. Extremities:  No clubbing, cyanosis, edema, or deformity noted with normal full range of motion of all joints.   Psych:  Cognition and judgment appear intact. Alert and cooperative with normal attention span and concentration. No apparent delusions, illusions, hallucinations   Impression & Recommendations:  Problem # 1:  OTHER ACUTE SINUSITIS (ICD-461.8) Assessment New worsening symptoms s/p bronchitis. likely bacterial. will treat with amoxicillin. The following medications were removed from the medication list:    Zithromax Z-pak 250 Mg Tabs (Azithromycin) ..... Use as directed    Cheratussin Ac 100-10 Mg/89ml Syrp (Guaifenesin-codeine) ..... One teaspoon at bedtime as needed cough Her updated medication list for this problem includes:    Amoxicillin 500 Mg Tabs (Amoxicillin) .Marland Kitchen... 1 tab by mouth two times a day x 10 days  Problem # 2:  TOBACCO ABUSE (ICD-305.1) Assessment: Unchanged rx for chantix sent. Her updated medication list for this problem includes:    Chantix  Starting Month Pak 0.5 Mg X 11 & 1 Mg X 42 Tabs (Varenicline tartrate) ..... Use as directed.  Complete Medication List: 1)  Clobex 0.05 % Sham (Clobetasol propionate) .... Use as directed 2)  Clobetasol Propionate 0.05 % Soln (Clobetasol propionate) .... Apply to scalp twice daily as needed for itching/irritation 3)  Mometasone Furoate 0.1 % Crea (Mometasone furoate) .... Once daily 4)  Budeprion Sr 150 Mg Tb12 (Bupropion hcl)  .Marland Kitchen.. 1 tab po daily x 3 days, then 1 by mouth 2 times daily not taking 5)  Valacyclovir Hcl 500 Mg Tabs (Valacyclovir hcl) .... Take one tablet by mouth twice a day for three days 6)  Fluconazole 150 Mg Tabs (Fluconazole) .... One x 1 7)  Amoxicillin 500 Mg Tabs (Amoxicillin) .Marland Kitchen.. 1 tab by mouth two times a day x 10 days 8)  Chantix Starting Month Pak 0.5 Mg X 11 & 1 Mg X 42 Tabs (Varenicline tartrate) .... Use as directed.  Patient Instructions: 1)  Try Debrox over the counter. 2)  Also try Mucinex for the mucous in your chest.   Prescriptions: CHANTIX STARTING MONTH PAK 0.5 MG X 11 & 1 MG X 42 TABS (VARENICLINE TARTRATE) Use as directed.  #1 x 0   Entered and Authorized by:   Ruthe Mannan MD   Signed by:   Ruthe Mannan MD on 01/15/2011   Method used:   [facility]        [facility]  [address] [id number]       [address]       [place], [place]  [address]       Ph: [phone] or [phone]       Fax: [phone]   RxID:   [id number] AMOXICILLIN 500 MG TABS (AMOXICILLIN) 1 tab by mouth two times a day x 10 days  #20 x 0   Entered and Authorized by:   Ruthe Mannan MD   Signed by:   Ruthe Mannan MD on 01/15/2011   Method used:   [facility]ically to        [facility]  [address] [id number] (retail)       [address]       [place], [place]  [address]       Ph: [phone] or [phone]       Fax: [phone]   RxID:   [id number]    Orders Added: 1)  Est. Patient Level IV [42706]    Current Allergies (reviewed today): No known allergies

## 2011-02-04 ENCOUNTER — Telehealth: Payer: Self-pay | Admitting: Family Medicine

## 2011-02-10 NOTE — Progress Notes (Signed)
Summary: Rx Valacyclovir  Phone Note Refill Request Call back at [phone] Message [facility]:  [facility] on February 04, 2011 7:56 AM  Refills Requested: Medication #1:  VALACYCLOVIR HCL 500 MG TABS take one tablet by mouth twice a day for three days   Last Refilled: 11/23/2010 Received E-script request please advise.   Method Requested: Electronic Initial call taken by: Linde Gillis CMA Duncan Dull),  February 04, 2011 7:57 AM    Prescriptions: VALACYCLOVIR HCL 500 MG TABS (VALACYCLOVIR HCL) take one tablet by mouth twice a day for three days  #20 x 2   Entered and Authorized by:   Ruthe Mannan MD   Signed by:   Ruthe Mannan MD on 02/04/2011   Method used:   Electronically to        [facility]  [place] (retail)       [address]       [place], [place]  [address]       Ph: [phone] or [phone]       Fax: [phone]   RxID:   [id number]

## 2011-03-18 ENCOUNTER — Encounter: Payer: Self-pay | Admitting: Family Medicine

## 2011-03-19 ENCOUNTER — Ambulatory Visit (INDEPENDENT_AMBULATORY_CARE_PROVIDER_SITE_OTHER): Payer: Managed Care, Other (non HMO) | Admitting: Family Medicine

## 2011-03-19 ENCOUNTER — Encounter: Payer: Self-pay | Admitting: Family Medicine

## 2011-03-19 VITALS — BP 108/70 | HR 76 | Temp 98.4°F | Wt 193.1 lb

## 2011-03-19 DIAGNOSIS — F17200 Nicotine dependence, unspecified, uncomplicated: Secondary | ICD-10-CM

## 2011-03-19 DIAGNOSIS — R51 Headache: Secondary | ICD-10-CM

## 2011-03-19 DIAGNOSIS — R519 Headache, unspecified: Secondary | ICD-10-CM | POA: Insufficient documentation

## 2011-03-19 DIAGNOSIS — B349 Viral infection, unspecified: Secondary | ICD-10-CM | POA: Insufficient documentation

## 2011-03-19 DIAGNOSIS — B9789 Other viral agents as the cause of diseases classified elsewhere: Secondary | ICD-10-CM

## 2011-03-19 MED ORDER — FLUTICASONE PROPIONATE 50 MCG/ACT NA SUSP: 2.0000 | Freq: Every day | NASAL | Status: DC

## 2011-03-19 MED ORDER — CLOBETASOL PROPIONATE 0.05 % EX SHAM: MEDICATED_SHAMPOO | CUTANEOUS | Status: DC

## 2011-03-19 NOTE — Patient Instructions (Addendum)
I think this may be due to viral infection (stomach and congestion) as well as headache from sinus congestion. Push fluids and plenty of rest.  Consider humidifier at night. For headaches, sounds like sinus pressure headaches.  Start intranasal steroid, flonase 2 squirts daily.  Nasal saline as well.  Try to back off on tylenol.  May May start antihistamine to see if allergies contributing.

## 2011-03-19 NOTE — Assessment & Plan Note (Signed)
Attribute sxs to viral infection (gastro with URTI sxs). Recommend supportive care, update if not improving as expected.

## 2011-03-19 NOTE — Assessment & Plan Note (Signed)
Encouraged cessation.

## 2011-03-19 NOTE — Progress Notes (Signed)
  Subjective:    Patient ID: Beverly Moore, female    DOB: 04/06/1981, 30 y.o.   MRN: [id number]  HPI CC: abd pain, HA, sinus  4d h/o abd pain, started after eating tuna fish sandwich, with regurgitation of some of the sandwich.  Then started having diarrhea.  Yesterday BM started to become more solid.  Still feeling pain in stomach, described as cramping, seem associated with BMs.  Some burping with mild nausea.  No vomiting.  No blood in stool.  Voiding fine.  No dysuria, urgency, frequency.  Mild cough.  No weight changes.  Also has been having more headaches recently.  Has been taking lots of tylenol (q4 hours).  Doesn't have h/o migraines.  + worse with bending head forward, + occasional photo/phonophobia.  Thinks sinuses much more congested than normal.  + sneezing.  No RN, itchy eyes.  No fevers/chills, no new rashes, myalgia, arthralgias.  Has been trying to drink plenty of water.  No h/o allergies.  No h/o asthma.  No sick contacts.  Smoking 3/4 ppd.  chantix hasn't started yet.  Review of Systems Per HPI    Objective:   Physical Exam  Constitutional: She is oriented to person, place, and time. She appears well-developed and well-nourished. No distress.  HENT:  Head: Normocephalic and atraumatic.  Right Ear: External ear normal.  Left Ear: External ear normal.  Nose: Right sinus exhibits maxillary sinus tenderness. Right sinus exhibits no frontal sinus tenderness. Left sinus exhibits maxillary sinus tenderness. Left sinus exhibits no frontal sinus tenderness.  Mouth/Throat: Oropharynx is clear and moist. No oropharyngeal exudate.       Minimal sinus tenderness  Eyes: Conjunctivae and EOM are normal. Pupils are equal, round, and reactive to light. No scleral icterus.  Neck: Normal range of motion. Neck supple.  Cardiovascular: Normal rate, regular rhythm, normal heart sounds and intact distal pulses.   No murmur heard. Pulmonary/Chest: Effort normal and breath sounds normal.  No respiratory distress. She has no wheezes. She has no rales.  Abdominal: Soft. Bowel sounds are normal. She exhibits no distension and no mass. There is tenderness (mild BLQ tenderness to deep palpation). There is no rebound and no guarding.  Lymphadenopathy:    She has no cervical adenopathy.  Neurological: She is alert and oriented to person, place, and time. She has normal strength. No cranial nerve deficit (CN grossly intact). Gait normal.  Skin: Skin is warm and dry. No rash noted.          Assessment & Plan:

## 2011-03-19 NOTE — Assessment & Plan Note (Signed)
Anticipate sinus congestion component.  ? ETD.  rec start flonase, nasal saline.  Update [place] if not improved with these measures.

## 2011-05-19 ENCOUNTER — Other Ambulatory Visit: Payer: Self-pay | Admitting: Family Medicine

## 2011-05-19 NOTE — Telephone Encounter (Signed)
Looks like pcp filled.  Pharmacy notification so no need to notify pt.

## 2011-05-19 NOTE — Telephone Encounter (Signed)
I did fill this in past at [facility] but will route to PCP for decision on refill.

## 2011-06-13 ENCOUNTER — Other Ambulatory Visit: Payer: Self-pay | Admitting: Family Medicine

## 2011-06-14 NOTE — Telephone Encounter (Signed)
Will route to PCP.  Previously prescribed for psoriasis.

## 2011-07-02 ENCOUNTER — Ambulatory Visit (INDEPENDENT_AMBULATORY_CARE_PROVIDER_SITE_OTHER): Payer: Managed Care, Other (non HMO) | Admitting: Internal Medicine

## 2011-07-02 ENCOUNTER — Encounter: Payer: Self-pay | Admitting: Internal Medicine

## 2011-07-02 VITALS — BP 120/70 | HR 63 | Temp 98.4°F | Ht 68.0 in | Wt 194.0 lb

## 2011-07-02 DIAGNOSIS — J309 Allergic rhinitis, unspecified: Secondary | ICD-10-CM

## 2011-07-02 DIAGNOSIS — L408 Other psoriasis: Secondary | ICD-10-CM

## 2011-07-02 DIAGNOSIS — J019 Acute sinusitis, unspecified: Secondary | ICD-10-CM

## 2011-07-02 MED ORDER — FLUTICASONE PROPIONATE 50 MCG/ACT NA SUSP: 2.0000 | Freq: Every day | NASAL | Status: DC

## 2011-07-02 MED ORDER — AMOXICILLIN 500 MG PO TABS: 1000.0000 mg | ORAL_TABLET | Freq: Two times a day (BID) | ORAL | Status: AC

## 2011-07-02 MED ORDER — CLOBETASOL PROPIONATE 0.05 % EX OINT: TOPICAL_OINTMENT | Freq: Two times a day (BID) | CUTANEOUS | Status: AC | PRN

## 2011-07-02 NOTE — Patient Instructions (Signed)
Please start fluticasone spray--2 sprays in each nostril daily. If you use for 1-2 months without any improvement, it is reasonable to stop Please start the antibiotic, amoxicillin, if you are not getting better in the next couple of days

## 2011-07-02 NOTE — Assessment & Plan Note (Signed)
May be viral but has had ongoing sinus problems since moving here 5 years ago Will treat with amoxil Try fluticasone for the chronic congestion

## 2011-07-02 NOTE — Progress Notes (Signed)
Subjective:    Patient ID: Beverly Moore, female    DOB: 1981-09-30, 30 y.o.   MRN: [id number]  HPI Having sinus problems all week Stuffy and hard to breath Yesterday noted lump in throat---bothers her to swallow Worse this AM  Sneezing and coughing a lot of mucus Yellowish from both places No overt PND unless she sniffles in  No fever that she can tell No sweats, shakes or chills No SOB No sig ear pain  Tried claritin and benedryl--not really helping Long standing allergy issues or sinus problems Has been trying OTC pain relievers  Current Outpatient Prescriptions on File Prior to Visit  Medication Sig Dispense Refill  . clobetasol (OLUX) 0.05 % topical foam Apply topically 2 (two) times daily. Apply to scalp for itching and irritation       . clobetasol (TEMOVATE) 0.05 % external solution APPLY TO SCALP TWICE DAILY AS NEEDED FOR ITCHING/IRRITATION  50 mL  0  . Clobetasol Propionate 0.05 % shampoo USE AS DIRECTED  118 mL  0  . valACYclovir (VALTREX) 500 MG tablet Take 500 mg by mouth 2 (two) times daily.        . varenicline (CHANTIX PAK) 0.5 MG X 11 & 1 MG X 42 tablet Take by mouth as directed. Take one 0.5mg  tablet by mouth once daily for 3 days, then increase to one 0.5mg  tablet twice daily for 3 days, then increase to one 1mg  tablet twice daily.         No Known Allergies  Past Medical History  Diagnosis Date  . BV (bacterial vaginosis)     recurrent  . Absence of menstruation   . Acute bronchitis   . Cellulitis and abscess of unspecified site   . Dysphagia, unspecified   . Genital herpes, unspecified   . Other acute sinusitis   . Other psoriasis   . Anal fissure   . Screening for lipoid disorders   . Tobacco use disorder     No past surgical history on file.  Family History  Problem Relation Age of Onset  . Hypertension Father   . Diabetes Father   . Cancer Father     blood  . Hypertension Mother   . Colon cancer  50    grandfather    History    Social History  . Marital Status: Married    Spouse Name: N/A    Number of Children: N/A  . Years of Education: N/A   Occupational History  . in [employer]    Social History Main Topics  . Smoking status: Current Everyday Smoker -- 0.8 packs/day    Types: Cigarettes  . Smokeless tobacco: Not on file  . Alcohol Use: Yes     Occasional  . Drug Use: No  . Sexually Active: Not on file   Other Topics Concern  . Not on file   Social History Narrative   Relocated from [place] 3 years ago. Married in 10/10. Lives in [place].   Review of Systems No nausea or vomiting Appetite is okay--may be off some     Objective:   Physical Exam  Constitutional: She appears well-developed and well-nourished. No distress.  HENT:  Head: Normocephalic and atraumatic.  Right Ear: External ear normal.  Left Ear: External ear normal.       Mild frontal and maxillary tenderness Moderate nasal swelling and inflammatory  Neck: Normal range of motion. Neck supple.  Pulmonary/Chest: Effort normal and breath sounds normal. No respiratory  distress. She has no wheezes. She has no rales.  Lymphadenopathy:    She has no cervical adenopathy.          Assessment & Plan:

## 2011-07-02 NOTE — Assessment & Plan Note (Signed)
Ongoing problems since being in [place] Not really helped by antihistamines Will try flonase

## 2011-07-02 NOTE — Assessment & Plan Note (Addendum)
Does okay but uses clobetasol prn Plans to set up with derm---doesn't use the different products together

## 2011-08-19 ENCOUNTER — Encounter: Payer: Self-pay | Admitting: *Deleted

## 2011-08-19 ENCOUNTER — Ambulatory Visit (INDEPENDENT_AMBULATORY_CARE_PROVIDER_SITE_OTHER): Payer: Managed Care, Other (non HMO) | Admitting: Family Medicine

## 2011-08-19 ENCOUNTER — Encounter: Payer: Self-pay | Admitting: Family Medicine

## 2011-08-19 ENCOUNTER — Ambulatory Visit: Payer: Managed Care, Other (non HMO) | Admitting: Family Medicine

## 2011-08-19 DIAGNOSIS — J069 Acute upper respiratory infection, unspecified: Secondary | ICD-10-CM

## 2011-08-19 DIAGNOSIS — J329 Chronic sinusitis, unspecified: Secondary | ICD-10-CM

## 2011-08-19 MED ORDER — AZITHROMYCIN 250 MG PO TABS: ORAL_TABLET | ORAL | Status: AC

## 2011-08-19 MED ORDER — GUAIFENESIN-CODEINE 100-10 MG/5ML PO SYRP: 5.0000 mL | ORAL_SOLUTION | Freq: Two times a day (BID) | ORAL | Status: DC | PRN

## 2011-08-19 NOTE — Patient Instructions (Signed)
Take antibiotic as directed.  Drink lots of fluids.  Treat sympotmatically with Mucinex, nasal saline irrigation, and Tylenol/Ibuprofen. Also try claritin D or zyrtec D over the counter- two times a day as needed ( have to sign for them at pharmacy). You can use warm compresses.  Cough suppressant at night. Call if not improving as expected in 5-7 days.    

## 2011-08-19 NOTE — Progress Notes (Signed)
SUBJECTIVE:  Beverly Moore is a 30 y.o. female who complains of coryza, congestion, sore throat, myalgias and headache for 7 days. She denies a history of chills, dizziness, shortness of breath, vomiting and weakness and denies a history of asthma. Patient does smoke cigarettes.   OBJECTIVE: BP 130/80  Pulse 94  Temp(Src) 98.7 F (37.1 C) (Oral)  Wt 196 lb 12 oz (89.245 kg)  LMP 08/12/2011  She appears well, vital signs are as noted. Ears normal.  Throat and pharynx normal.  Neck supple. No adenopathy in the neck. Nose is congested. Sinuses non tender. The chest is clear, without wheezes or rales.  ASSESSMENT:  sinusitis and allergic rhinitis  PLAN: Given duration and progression of symptoms, will treat for bacterial sinusitis. Symptomatic therapy suggested: push fluids, rest and return office visit prn if symptoms persist or worsen.  Call or return to clinic prn if these symptoms worsen or fail to improve as anticipated.

## 2011-08-20 ENCOUNTER — Telehealth: Payer: Self-pay | Admitting: *Deleted

## 2011-08-20 MED ORDER — AMOXICILLIN 875 MG PO TABS: 875.0000 mg | ORAL_TABLET | Freq: Two times a day (BID) | ORAL | Status: DC

## 2011-08-20 NOTE — Telephone Encounter (Signed)
Reviewed note.  Treated for sinusitis and allergic rhinitis with zpack. Recommend stop zpack.  Most abx can cause abd pain/nausea.   To push fluids.  May try other abx (sent to pharmacy), but may recommend holding abx for now to see if improves on own first. Has she taken amoxicillin in past ok? If not improving or fevers >101, may need to be seen over weekend.

## 2011-08-20 NOTE — Telephone Encounter (Signed)
Patient notified. She will stop zpack, push fluids and see if any improvement on her own. If not, she will fill new abx. No problems with amoxicillin before. She will go to [facility] if worsening or any fever over 101.

## 2011-08-20 NOTE — Telephone Encounter (Signed)
Patient was seen by Dr. Dayton Martes and was prescribed a zpak.  Since taking the medication she is having stomach pains,diarrhea and headaches.  She stated that she is taking the medication with food and it still seems to maker her sick.  Please advise.  [facility].

## 2011-08-26 ENCOUNTER — Ambulatory Visit (INDEPENDENT_AMBULATORY_CARE_PROVIDER_SITE_OTHER): Payer: Managed Care, Other (non HMO) | Admitting: Family Medicine

## 2011-08-26 ENCOUNTER — Encounter: Payer: Self-pay | Admitting: Family Medicine

## 2011-08-26 DIAGNOSIS — J209 Acute bronchitis, unspecified: Secondary | ICD-10-CM

## 2011-08-26 MED ORDER — HYDROCODONE-HOMATROPINE 5-1.5 MG/5ML PO SYRP: 5.0000 mL | ORAL_SOLUTION | Freq: Every day | ORAL | Status: AC

## 2011-08-26 NOTE — Assessment & Plan Note (Signed)
Complete amoxicillin course... No clear reason to broaden antibiotics at this point. She has not completed a full course of antibiotics yet. Add cough suppressant to help rest at night. Add nasal saline to help fluid in ears drain. Continue mucinex with DM for cough during the day.

## 2011-08-26 NOTE — Patient Instructions (Signed)
Complete amoxicillin course. Add nasal saline irrigation. Start cough suppressant with hydrocodone at night. Continue mucinex during the day.  Call if not improving in 5-7 more days.

## 2011-08-26 NOTE — Progress Notes (Signed)
  Subjective:    Patient ID: Beverly Moore, female    DOB: 1981-12-03, 30 y.o.   MRN: [id number]  HPI  30 year old female began treatment with Z-pack for acute sinusitits after seeing Dr. Dayton Martes on 8/30. She returns to clinic today with minimal improvement with symptoms. Stopped after 2 days per on call MD given SE. Started amoxicillin 875mg   5 days ago for 10 days.  She admits symptoms some improved, but not much. No further face pain, ear pain. Having continued ear congestion, coughing fits, coughing at night. Dry cough, occ productive clear yellow. Stomach hurting for cough, back hurting from cough. No fever.    Review of Systems  Constitutional: Negative for fever and fatigue.  HENT: Positive for ear pain.   Eyes: Positive for pain.  Respiratory: Positive for cough. Negative for shortness of breath and wheezing.   Cardiovascular: Negative for chest pain.  Gastrointestinal: Negative for abdominal pain.       Objective:   Physical Exam  Constitutional: Vital signs are normal. She appears well-developed and well-nourished. She is cooperative.  Non-toxic appearance. She does not appear ill. No distress.  HENT:  Head: Normocephalic.  Right Ear: Hearing, tympanic membrane, external ear and ear canal normal. Tympanic membrane is not erythematous, not retracted and not bulging.  Left Ear: Hearing, tympanic membrane, external ear and ear canal normal. Tympanic membrane is not erythematous, not retracted and not bulging.  Nose: Mucosal edema and rhinorrhea present. Right sinus exhibits no maxillary sinus tenderness and no frontal sinus tenderness. Left sinus exhibits no maxillary sinus tenderness and no frontal sinus tenderness.  Mouth/Throat: Uvula is midline, oropharynx is clear and moist and mucous membranes are normal.  Eyes: Conjunctivae, EOM and lids are normal. Pupils are equal, round, and reactive to light. No foreign bodies found.  Neck: Trachea normal and normal range of  motion. Neck supple. Carotid bruit is not present. No mass and no thyromegaly present.  Cardiovascular: Normal rate, regular rhythm, S1 normal, S2 normal, normal heart sounds, intact distal pulses and normal pulses.  Exam reveals no gallop and no friction rub.   No murmur heard. Pulmonary/Chest: Effort normal and breath sounds normal. Not tachypneic. No respiratory distress. She has no decreased breath sounds. She has no wheezes. She has no rhonchi. She has no rales.  Genitourinary: Vagina normal and uterus normal.  Neurological: She is alert.  Skin: Skin is warm, dry and intact. No rash noted.  Psychiatric: Her speech is normal and behavior is normal. Judgment normal. Her mood appears not anxious. Cognition and memory are normal. She does not exhibit a depressed mood.          Assessment & Plan:

## 2011-09-22 ENCOUNTER — Other Ambulatory Visit: Payer: Self-pay | Admitting: *Deleted

## 2011-09-22 MED ORDER — VALACYCLOVIR HCL 500 MG PO TABS: 500.0000 mg | ORAL_TABLET | Freq: Two times a day (BID) | ORAL | Status: DC

## 2011-09-24 ENCOUNTER — Ambulatory Visit (INDEPENDENT_AMBULATORY_CARE_PROVIDER_SITE_OTHER): Payer: Managed Care, Other (non HMO) | Admitting: Family Medicine

## 2011-09-24 ENCOUNTER — Encounter: Payer: Self-pay | Admitting: Family Medicine

## 2011-09-24 VITALS — BP 120/70 | HR 73 | Temp 98.2°F | Ht 69.0 in | Wt 191.8 lb

## 2011-09-24 DIAGNOSIS — Z23 Encounter for immunization: Secondary | ICD-10-CM

## 2011-09-24 DIAGNOSIS — S61219A Laceration without foreign body of unspecified finger without damage to nail, initial encounter: Secondary | ICD-10-CM | POA: Insufficient documentation

## 2011-09-24 DIAGNOSIS — J40 Bronchitis, not specified as acute or chronic: Secondary | ICD-10-CM

## 2011-09-24 DIAGNOSIS — S61209A Unspecified open wound of unspecified finger without damage to nail, initial encounter: Secondary | ICD-10-CM

## 2011-09-24 MED ORDER — ALBUTEROL SULFATE HFA 108 (90 BASE) MCG/ACT IN AERS: 2.0000 | INHALATION_SPRAY | Freq: Four times a day (QID) | RESPIRATORY_TRACT | Status: DC | PRN

## 2011-09-24 MED ORDER — FLUCONAZOLE 150 MG PO TABS: 150.0000 mg | ORAL_TABLET | Freq: Once | ORAL | Status: AC

## 2011-09-24 MED ORDER — AMOXICILLIN-POT CLAVULANATE 875-125 MG PO TABS: 1.0000 | ORAL_TABLET | Freq: Two times a day (BID) | ORAL | Status: DC

## 2011-09-24 NOTE — Patient Instructions (Signed)
Good to see you. Try Allegra over the counter in addition to taking your antibiotic.

## 2011-09-24 NOTE — Progress Notes (Signed)
SUBJECTIVE:  Beverly Moore is a 30 y.o. female who complains of coryza, congestion, sore throat, myalgias and headache for 3 weeks. She denies a history of chills, dizziness, shortness of breath, vomiting and weakness and denies a history of asthma. Patient does smoke cigarettes.  Finger laceration- cut her 2nd right digit on rusty nail. Due for Tdap.   OBJECTIVE: BP 120/70  Pulse 73  Temp(Src) 98.2 F (36.8 C) (Oral)  Ht 5\' 9"  (1.753 m)  Wt 191 lb 12 oz (86.977 kg)  BMI 28.32 kg/m2  She appears well, vital signs are as noted. Ears normal.  Throat and pharynx normal.  Neck supple. No adenopathy in the neck. Nose is congested. Sinuses non tender. Scattered wheezes in bilaterally lung bases.  No increased WOB. Skin:  1 cm well healed laceration on tip of 2nd digit, no erythema or pus ASSESSMENT/PLAN:  1.bronchitis- new .Given duration and progression of symptoms, will treat for bronchitis with Augmentin, proair. Counseled on smoking cessation. Symptomatic therapy suggested: push fluids, rest and return office visit prn if symptoms persist or worsen.  Call or return to clinic prn if these symptoms worsen or fail to improve as anticipated.   2.  Skin lac-New   Supportive care, Tdap given.

## 2011-09-27 ENCOUNTER — Telehealth: Payer: Self-pay | Admitting: *Deleted

## 2011-09-27 MED ORDER — AMOXICILLIN-POT CLAVULANATE 875-125 MG PO TABS: 1.0000 | ORAL_TABLET | Freq: Two times a day (BID) | ORAL | Status: AC

## 2011-09-27 MED ORDER — ALBUTEROL SULFATE HFA 108 (90 BASE) MCG/ACT IN AERS: 2.0000 | INHALATION_SPRAY | Freq: Four times a day (QID) | RESPIRATORY_TRACT | Status: DC | PRN

## 2011-09-27 MED ORDER — FLUCONAZOLE 150 MG PO TABS: 150.0000 mg | ORAL_TABLET | Freq: Once | ORAL | Status: AC

## 2011-09-27 NOTE — Telephone Encounter (Signed)
[facility] Triage Call Report Triage Record Num: [id number] Operator: Lesli Albee Patient Name: Beverly Moore Call Date & Time: 09/25/2011 1:33:55PM Patient Phone: [phone] PCP: Ruthe Mannan Patient Gender: Female PCP Fax : [phone] Patient DOB: [birth year] Practice Name: [facility] Reason for Call: Pt is calling to say she was dx with bronchitis at the office yesterday after seeing Dr. Dayton Martes. Pt was to have an abx; inhaler and [facility] [address] [facility] in [place] and no script has been called in. Not on EPIC at this time. RN called MD on call Dr. Linford Arnold who will check the chart and call the pt back in appx 1 hour. Protocol(s) Used: Medication Question Calls, No Triage (Adults) Recommended Outcome per Protocol: Call Provider within 24 Hours Reason for Outcome: Caller has non urgent medication question about med that PCP prescribed and triager unable to answer question Care Advice: ~ 09/25/2011 1:44:33PM Page 1 of 1 [id number]

## 2011-09-28 LAB — POCT PREGNANCY, URINE
Operator id: 196461
Preg Test, Ur: NEGATIVE

## 2011-09-28 LAB — URINE CULTURE: Colony Count: 7000

## 2011-09-28 LAB — URINALYSIS, ROUTINE W REFLEX MICROSCOPIC
Bilirubin Urine: NEGATIVE
Ketones, ur: NEGATIVE
Nitrite: NEGATIVE
Protein, ur: NEGATIVE

## 2011-09-28 LAB — URINE MICROSCOPIC-ADD ON

## 2011-10-06 ENCOUNTER — Telehealth: Payer: Self-pay | Admitting: *Deleted

## 2011-10-06 NOTE — Telephone Encounter (Signed)
Left message on cell phone voicemail advising patient as instructed.   

## 2011-10-06 NOTE — Telephone Encounter (Signed)
Pt states she was given augmentin for bronchitis.  She didn't know she was to take this twice a day and only took it once a day for 8 days.  Started taking twice a day on Monday.  She is not feeling any better.  What should she do now?  Continue at twice a day dosing until done?

## 2011-10-06 NOTE — Telephone Encounter (Signed)
Yes please continue twice daily dosing until done.

## 2011-10-08 ENCOUNTER — Telehealth: Payer: Self-pay | Admitting: *Deleted

## 2011-10-08 MED ORDER — HYDROCODONE-HOMATROPINE 5-1.5 MG/5ML PO SYRP: 5.0000 mL | ORAL_SOLUTION | Freq: Three times a day (TID) | ORAL | Status: AC | PRN

## 2011-10-08 NOTE — Telephone Encounter (Signed)
Please call in as entered below. 

## 2011-10-08 NOTE — Telephone Encounter (Signed)
Hycodan called to [facility] [facility] [address].

## 2011-10-08 NOTE — Telephone Encounter (Signed)
Drink lots of fluids.  Treat sympotmatically with Mucinex D and Tylenol/Ibuprofen.  Would she like a prescription for a cough suppressant?

## 2011-10-08 NOTE — Telephone Encounter (Signed)
Advised pt, she said she has been drinking plenty of fluids. She would like something called in for her cough- had hycodan before.

## 2011-10-08 NOTE — Telephone Encounter (Signed)
Left message on machine for patient to call.

## 2011-10-08 NOTE — Telephone Encounter (Signed)
Pt has been taking augmentin twice a day for 5 days for bronchitis.  She is not much better.  Has a lot of sinus pressure and congestion, pain around eyes.  A lot of drainage in throat.  Having heartburn.  Still has cough and chest congestion.  Please advise on what to do.  [facility] [address].

## 2011-11-09 ENCOUNTER — Other Ambulatory Visit: Payer: Self-pay | Admitting: *Deleted

## 2011-11-09 MED ORDER — CLOBETASOL PROPIONATE 0.05 % EX SOLN: CUTANEOUS | Status: DC

## 2011-11-09 MED ORDER — CLOBETASOL PROPIONATE 0.05 % EX SHAM: MEDICATED_SHAMPOO | CUTANEOUS | Status: DC

## 2011-11-09 NOTE — Telephone Encounter (Signed)
Rx's sent electronically by Dr. Dayton Martes.

## 2011-11-09 NOTE — Telephone Encounter (Signed)
Last refill 05/19/2011.

## 2011-11-25 ENCOUNTER — Ambulatory Visit (INDEPENDENT_AMBULATORY_CARE_PROVIDER_SITE_OTHER): Payer: Managed Care, Other (non HMO) | Admitting: Family Medicine

## 2011-11-25 ENCOUNTER — Encounter: Payer: Self-pay | Admitting: Family Medicine

## 2011-11-25 VITALS — BP 120/80 | HR 80 | Temp 97.8°F | Ht 69.0 in | Wt 199.2 lb

## 2011-11-25 DIAGNOSIS — J309 Allergic rhinitis, unspecified: Secondary | ICD-10-CM

## 2011-11-25 NOTE — Progress Notes (Signed)
SUBJECTIVE:  Beverly Moore is a 30 y.o. female who complains of coryza, sneezing and sore throat for 7 days.  Started shortly after her allergist started her on Astepro.   She denies a history of anorexia, chest pain, shortness of breath, vomiting and weakness and denies a history of asthma.   Allergist told her she is allergic to dust mites. Wants to know if she should consider allergy shots.   Patient Active Problem List  Diagnoses  . GENITAL HERPES  . TOBACCO ABUSE  . RECTAL FISSURE  . AMENORRHEA  . CELLULITIS AND ABSCESS OF UNSPECIFIED SITE  . PSORIASIS  . DYSPHAGIA UNSPECIFIED  . Headache  . Allergic rhinitis, cause unspecified  . Acute bronchitis  . Bronchitis  . Finger laceration   Past Medical History  Diagnosis Date  . BV (bacterial vaginosis)     recurrent  . Absence of menstruation   . Acute bronchitis   . Cellulitis and abscess of unspecified site   . Dysphagia, unspecified   . Genital herpes, unspecified   . Other acute sinusitis   . Other psoriasis   . Anal fissure   . Screening for lipoid disorders   . Tobacco use disorder    No past surgical history on file. History  Substance Use Topics  . Smoking status: Current Everyday Smoker -- 0.8 packs/day    Types: Cigarettes  . Smokeless tobacco: Not on file   Comment: has the chantix but hasn't committed to stopping smoking yet  . Alcohol Use: Yes     Occasional   Family History  Problem Relation Age of Onset  . Hypertension Father   . Diabetes Father   . Cancer Father     blood  . Hypertension Mother   . Colon cancer  50    grandfather   No Known Allergies Current Outpatient Prescriptions on File Prior to Visit  Medication Sig Dispense Refill  . albuterol (PROAIR HFA) 108 (90 BASE) MCG/ACT inhaler Inhale 2 puffs into the lungs every 6 (six) hours as needed for wheezing.  1 Inhaler  1  . clobetasol (OLUX) 0.05 % topical foam Apply topically 2 (two) times daily. Apply to scalp for itching and  irritation       . clobetasol (TEMOVATE) 0.05 % external solution Apply to scalp twice daily as needed for itching/irritation  50 mL  0  . clobetasol (TEMOVATE) 0.05 % ointment Apply topically 2 (two) times daily as needed.  60 g  0  . Clobetasol Propionate 0.05 % shampoo Use as directed  118 mL  0  . fluticasone (FLONASE) 50 MCG/ACT nasal spray Place 2 sprays into the nose daily. In each nostril  16 g  12  . valACYclovir (VALTREX) 500 MG tablet Take 1 tablet (500 mg total) by mouth 2 (two) times daily.  20 tablet  3  . varenicline (CHANTIX PAK) 0.5 MG X 11 & 1 MG X 42 tablet Take by mouth as directed. Take one 0.5mg  tablet by mouth once daily for 3 days, then increase to one 0.5mg  tablet twice daily for 3 days, then increase to one 1mg  tablet twice daily.        The PMH, PSH, Social History, Family History, Medications, and allergies have been reviewed in [facility], and have been updated if relevant.  OBJECTIVE: BP 120/80  Pulse 80  Temp(Src) 97.8 F (36.6 C) (Oral)  Ht 5\' 9"  (1.753 m)  Wt 199 lb 4 oz (90.379 kg)  BMI 29.42 kg/m2  LMP  10/29/2011  She appears well, vital signs are as noted. Ears normal.  Throat and pharynx normal.  Neck supple. No adenopathy in the neck. Nose is congested. Sinuses non tender. The chest is clear, without wheezes or rales.  ASSESSMENT:  allergic rhinitis  PLAN: Advised stopping Astepro and calling allergist to see if they have other samples she could try. Also advised discussing allergy shots with allergist as well and we will request records. Symptomatic therapy suggested: push fluids, rest and return office visit prn if symptoms persist or worsen. Lack of antibiotic effectiveness discussed with her. Call or return to clinic prn if these symptoms worsen or fail to improve as anticipated.

## 2011-11-25 NOTE — Patient Instructions (Signed)
I would stop taking Astepro. Call you allergist to discuss shots and other options.

## 2012-01-24 ENCOUNTER — Other Ambulatory Visit: Payer: Self-pay | Admitting: Family Medicine

## 2012-02-02 ENCOUNTER — Encounter: Payer: Self-pay | Admitting: Family Medicine

## 2012-02-02 ENCOUNTER — Ambulatory Visit (INDEPENDENT_AMBULATORY_CARE_PROVIDER_SITE_OTHER): Payer: Managed Care, Other (non HMO) | Admitting: Family Medicine

## 2012-02-02 VITALS — BP 120/80 | HR 68 | Temp 98.1°F | Wt 207.5 lb

## 2012-02-02 DIAGNOSIS — R300 Dysuria: Secondary | ICD-10-CM

## 2012-02-02 DIAGNOSIS — N760 Acute vaginitis: Secondary | ICD-10-CM

## 2012-02-02 LAB — POCT URINALYSIS DIPSTICK
Glucose, UA: NEGATIVE
Ketones, UA: NEGATIVE
Leukocytes, UA: NEGATIVE
Protein, UA: NEGATIVE
Urobilinogen, UA: 0.2

## 2012-02-02 MED ORDER — VARENICLINE TARTRATE 1 MG PO TABS: 1.0000 mg | ORAL_TABLET | Freq: Two times a day (BID) | ORAL | Status: DC

## 2012-02-02 MED ORDER — FLUCONAZOLE 150 MG PO TABS: 150.0000 mg | ORAL_TABLET | Freq: Once | ORAL | Status: AC

## 2012-02-02 NOTE — Patient Instructions (Signed)
Vaginitis Vaginitis in a soreness, swelling and redness (inflammation) of the vagina and vulva. This is not a sexually transmitted infection.  CAUSES  Yeast vaginitis is caused by yeast (candida) that is normally found in your vagina. With a yeast infection, the candida has over grown in number to a point that upsets the chemical balance. SYMPTOMS   White thick vaginal discharge.   Swelling, itching, redness and irritation of the vagina and possibly the lips of the vagina (vulva).   Burning or painful urination.   Painful intercourse.  HOME CARE INSTRUCTIONS   Finish all medication as prescribed.   Do not have sex until treatment is completed or instructed by your healthcare giver.   Take warm sitz baths.   Do not douche.   Do not use tampons, especially scented ones.   Wear cotton underwear.   Avoid tight pants and panty hose.   Tell your sexual partner that you have a yeast infection. They should go to their caregiver if they have symptoms such as mild rash or itching.   Your sexual partner should be treated if your infection is difficult to eliminate.   Practice safer sex. Use condoms.   Some vaginal medications cause latex condoms to fail. Ask your caregiver this.  SEEK MEDICAL CARE IF:   You develop a fever.   The infection is getting worse after 2 days of treatment.   The infection is not getting better after 3 days of treatment.   You develop blisters in or around your vagina.   You develop vaginal bleeding, and it is not your menstrual period.   You have pain when you urinate.   You develop intestinal problems.   You have pain with sexual intercourse.  Document Released: 01/13/2005 Document Revised: 08/18/2011 Document Reviewed: 08/21/2009 Exit[employer] Patient Information 2012 [employer]. 

## 2012-02-02 NOTE — Progress Notes (Signed)
SUBJECTIVE:  31 y.o. female complains of white and copious vaginal discharge for 3 day(s). Denies abnormal vaginal bleeding or significant pelvic pain or fever. She has had some mild suprapubic pressure. Denies history of known exposure to STD.  Patient Active Problem List  Diagnoses  . GENITAL HERPES  . TOBACCO ABUSE  . RECTAL FISSURE  . AMENORRHEA  . CELLULITIS AND ABSCESS OF UNSPECIFIED SITE  . PSORIASIS  . DYSPHAGIA UNSPECIFIED  . Headache  . Allergic rhinitis, cause unspecified  . Acute bronchitis  . Bronchitis  . Finger laceration   Past Medical History  Diagnosis Date  . BV (bacterial vaginosis)     recurrent  . Absence of menstruation   . Acute bronchitis   . Cellulitis and abscess of unspecified site   . Dysphagia, unspecified   . Genital herpes, unspecified   . Other acute sinusitis   . Other psoriasis   . Anal fissure   . Screening for lipoid disorders   . Tobacco use disorder    No past surgical history on file. History  Substance Use Topics  . Smoking status: Current Everyday Smoker -- 0.8 packs/day    Types: Cigarettes  . Smokeless tobacco: Not on file   Comment: has the chantix but hasn't committed to stopping smoking yet  . Alcohol Use: Yes     Occasional   Family History  Problem Relation Age of Onset  . Hypertension Father   . Diabetes Father   . Cancer Father     blood  . Hypertension Mother   . Colon cancer  50    grandfather   No Known Allergies Current Outpatient Prescriptions on File Prior to Visit  Medication Sig Dispense Refill  . albuterol (PROAIR HFA) 108 (90 BASE) MCG/ACT inhaler Inhale 2 puffs into the lungs every 6 (six) hours as needed for wheezing.  1 Inhaler  1  . azelastine (ASTELIN) 137 MCG/SPRAY nasal spray Place 2 sprays into the nose 2 (two) times daily. Use in each nostril as directed       . clobetasol (OLUX) 0.05 % topical foam Apply topically 2 (two) times daily. Apply to scalp for itching and irritation       .  clobetasol (TEMOVATE) 0.05 % external solution Apply to scalp twice daily as needed for itching/irritation  50 mL  0  . clobetasol (TEMOVATE) 0.05 % ointment Apply topically 2 (two) times daily as needed.  60 g  0  . Clobetasol Propionate 0.05 % shampoo Use as directed  118 mL  0  . Clobetasol Propionate 0.05 % shampoo USE AS DIRECTED  118 mL  1  . fluticasone (FLONASE) 50 MCG/ACT nasal spray Place 2 sprays into the nose daily. In each nostril  16 g  12  . omeprazole (PRILOSEC) 20 MG capsule Take 20 mg by mouth 2 (two) times daily.        . valACYclovir (VALTREX) 500 MG tablet Take 1 tablet (500 mg total) by mouth 2 (two) times daily.  20 tablet  3  . varenicline (CHANTIX PAK) 0.5 MG X 11 & 1 MG X 42 tablet Take by mouth as directed. Take one 0.5mg  tablet by mouth once daily for 3 days, then increase to one 0.5mg  tablet twice daily for 3 days, then increase to one 1mg  tablet twice daily.        The PMH, PSH, Social History, Family History, Medications, and allergies have been reviewed in [facility], and have been updated if relevant.  OBJECTIVE:  BP 120/80  Pulse 68  Temp(Src) 98.1 F (36.7 C) (Oral)  Wt 207 lb 8 oz (94.121 kg)  LMP 01/20/2012  She appears well, afebrile. Abdomen: benign, soft, nontender, no masses. Pelvic Exam: normal external genitalia, vulva, vagina, cervix, uterus and adnexa with mod white curd like discharge in vault. Urine dipstick: negative for all components.  ASSESSMENT:  C. albicans vulvovaginitis  PLAN:   Treatment: abstain from coitus during course of treatment and diflucan 150 mg po x 1. ROV prn if symptoms persist or worsen.

## 2012-02-07 ENCOUNTER — Encounter: Payer: Self-pay | Admitting: Family Medicine

## 2012-02-07 ENCOUNTER — Ambulatory Visit (INDEPENDENT_AMBULATORY_CARE_PROVIDER_SITE_OTHER): Payer: Managed Care, Other (non HMO) | Admitting: Family Medicine

## 2012-02-07 ENCOUNTER — Telehealth: Payer: Self-pay | Admitting: Family Medicine

## 2012-02-07 VITALS — BP 126/78 | HR 76 | Temp 98.7°F | Wt 203.2 lb

## 2012-02-07 DIAGNOSIS — R309 Painful micturition, unspecified: Secondary | ICD-10-CM

## 2012-02-07 DIAGNOSIS — R300 Dysuria: Secondary | ICD-10-CM

## 2012-02-07 LAB — POCT URINALYSIS DIPSTICK
Bilirubin, UA: NEGATIVE
Nitrite, UA: 8
Protein, UA: NEGATIVE
pH, UA: 7

## 2012-02-07 MED ORDER — CIPROFLOXACIN HCL 500 MG PO TABS: 500.0000 mg | ORAL_TABLET | Freq: Two times a day (BID) | ORAL | Status: AC

## 2012-02-07 NOTE — Telephone Encounter (Signed)
Needs to be seen again.

## 2012-02-07 NOTE — Assessment & Plan Note (Addendum)
UA/micro consistent with UTI.  ? Possible contamination so pt collected another sample and that was sent for culture. Treat with cipro 500mg  bid x 3 days. See pt instructions.

## 2012-02-07 NOTE — Telephone Encounter (Signed)
[place], [place] [address] p. [phone] f. [phone] To: [facility] (Daytime Triage) Fax: [phone] From: [facility] Date/ Time: 02/07/2012 8:52 AM Taken By: Lesli Albee, RN Caller: Victorino Dike Facility: not collected Patient: Beverly, Moore DOB: 17-Jan-1981 Phone: [phone] Reason for Call: Pt was into see MD last week for dysuria. Pt was treated with Diflucan for yeast infection but UTI was negative. Pt is calling back to say the vaginal d/c has resolved but she is still having dysuria, frequency and uegency. Regarding Appointment: Appt Date: Appt Time: Unknown Provider: Reason: Details: Outcome:

## 2012-02-07 NOTE — Progress Notes (Signed)
  Subjective:    Patient ID: Beverly Moore, female    DOB: [birth year], 31 y.o.   MRN: [id number]  HPI CC: UTI?  Last week seen by PCP, dx with candidal vaginitis and treated with diflucan x1.  This resolved vag discharge but has continued to have abdominal pain when voiding.  Endorses vaginal discomfort as well intermittently not associated with voiding.  + suprapubic pressure.  Also endorsing urgency, frequency.  No dysuria per se.  More abd pain with voiding, at end of stream.    Denies fevers/chills, nausea/vomiting, hematuria, back pain.  LMP - 01/19/2012.  Was early this month.  Review of Systems Per HPI    Objective:   Physical Exam  Nursing note and vitals reviewed. Constitutional: She appears well-developed and well-nourished. No distress.  Abdominal: Soft. Bowel sounds are normal. She exhibits no distension. There is no hepatosplenomegaly. There is tenderness in the suprapubic area. There is no rebound, no guarding and no CVA tenderness.  Musculoskeletal: She exhibits no edema.  Skin: Skin is warm and dry. No rash noted.  Psychiatric: She has a normal mood and affect.       Assessment & Plan:

## 2012-02-07 NOTE — Telephone Encounter (Signed)
Spoke with patient and added to today's schedule.

## 2012-02-07 NOTE — Patient Instructions (Signed)
Does look like urinary infection today. Treat with cipro 500mg  twice daily for 5 days. Update [place] if symptoms not improved after this. May use tylenol for discomfort.  Push fluids and rest (water, cranberry juice).  Urinary Tract Infection Infections of the urinary tract can start in several places. A bladder infection (cystitis), a kidney infection (pyelonephritis), and a prostate infection (prostatitis) are different types of urinary tract infections (UTIs). They usually get better if treated with medicines (antibiotics) that kill germs. Take all the medicine until it is gone. You or your child may feel better in a few days, but TAKE ALL MEDICINE or the infection may not respond and may become more difficult to treat. HOME CARE INSTRUCTIONS   Drink enough water and fluids to keep the urine clear or pale yellow. Cranberry juice is especially recommended, in addition to large amounts of water.   Avoid caffeine, tea, and carbonated beverages. They tend to irritate the bladder.   Alcohol may irritate the prostate.   Only take over-the-counter or prescription medicines for pain, discomfort, or fever as directed by your caregiver.  To prevent further infections:  Empty the bladder often. Avoid holding urine for long periods of time.   After a bowel movement, women should cleanse from front to back. Use each tissue only once.   Empty the bladder before and after sexual intercourse.  FINDING OUT THE RESULTS OF YOUR TEST Not all test results are available during your visit. If your or your child's test results are not back during the visit, make an appointment with your caregiver to find out the results. Do not assume everything is normal if you have not heard from your caregiver or the medical facility. It is important for you to follow up on all test results. SEEK MEDICAL CARE IF:   There is back pain.   Your baby is older than 3 months with a rectal temperature of 100.5 F (38.1 C) or higher  for more than 1 day.   Your or your child's problems (symptoms) are no better in 3 days. Return sooner if you or your child is getting worse.  SEEK IMMEDIATE MEDICAL CARE IF:   There is severe back pain or lower abdominal pain.   You or your child develops chills.   You have a fever.   Your baby is older than 3 months with a rectal temperature of 102 F (38.9 C) or higher.   Your baby is 6 months old or younger with a rectal temperature of 100.4 F (38 C) or higher.   There is nausea or vomiting.   There is continued burning or discomfort with urination.  MAKE SURE YOU:   Understand these instructions.   Will watch your condition.   Will get help right away if you are not doing well or get worse.  Document Released: 09/15/2005 Document Revised: 08/18/2011 Document Reviewed: 04/20/2007 [facility] Patient Information 2012 [place], [place].

## 2012-03-02 ENCOUNTER — Encounter: Payer: Self-pay | Admitting: Family Medicine

## 2012-03-02 ENCOUNTER — Ambulatory Visit (INDEPENDENT_AMBULATORY_CARE_PROVIDER_SITE_OTHER): Payer: Managed Care, Other (non HMO) | Admitting: Family Medicine

## 2012-03-02 VITALS — BP 140/90 | HR 80 | Temp 97.8°F | Wt 206.0 lb

## 2012-03-02 DIAGNOSIS — L732 Hidradenitis suppurativa: Secondary | ICD-10-CM

## 2012-03-02 MED ORDER — DOXYCYCLINE HYCLATE 100 MG PO TABS: 100.0000 mg | ORAL_TABLET | Freq: Two times a day (BID) | ORAL | Status: DC

## 2012-03-02 MED ORDER — VARENICLINE TARTRATE 0.5 MG X 11 & 1 MG X 42 PO MISC: ORAL | Status: DC

## 2012-03-02 MED ORDER — VARENICLINE TARTRATE 0.5 MG X 11 & 1 MG X 42 PO MISC: ORAL | Status: AC

## 2012-03-02 NOTE — Progress Notes (Signed)
SUBJECTIVE:  31 y.o. female complains of multiple boils under arm pits, around panty line and bra line.  She has had them for years- comes and goes. Lately, ones under her bra line appear more red and inflamed, sometimes drain.  No fevers or chills. No n/v/d.  Patient Active Problem List  Diagnoses  . GENITAL HERPES  . TOBACCO ABUSE  . RECTAL FISSURE  . AMENORRHEA  . CELLULITIS AND ABSCESS OF UNSPECIFIED SITE  . PSORIASIS  . DYSPHAGIA UNSPECIFIED  . Headache  . Allergic rhinitis, cause unspecified  . Finger laceration  . Urinary pain   Past Medical History  Diagnosis Date  . BV (bacterial vaginosis)     recurrent  . Absence of menstruation   . Acute bronchitis   . Cellulitis and abscess of unspecified site   . Dysphagia, unspecified   . Genital herpes, unspecified   . Other acute sinusitis   . Other psoriasis   . Anal fissure   . Screening for lipoid disorders   . Tobacco use disorder    No past surgical history on file. History  Substance Use Topics  . Smoking status: Current Everyday Smoker -- 0.5 packs/day    Types: Cigarettes  . Smokeless tobacco: Not on file   Comment: has the chantix but hasn't committed to stopping smoking yet  . Alcohol Use: Yes     Occasional   Family History  Problem Relation Age of Onset  . Hypertension Father   . Diabetes Father   . Cancer Father     blood  . Hypertension Mother   . Colon cancer  50    grandfather   No Known Allergies Current Outpatient Prescriptions on File Prior to Visit  Medication Sig Dispense Refill  . albuterol (PROAIR HFA) 108 (90 BASE) MCG/ACT inhaler Inhale 2 puffs into the lungs every 6 (six) hours as needed for wheezing.  1 Inhaler  1  . azelastine (ASTELIN) 137 MCG/SPRAY nasal spray Place 2 sprays into the nose 2 (two) times daily. Use in each nostril as directed       . clobetasol (OLUX) 0.05 % topical foam Apply topically 2 (two) times daily. Apply to scalp for itching and irritation       .  clobetasol (TEMOVATE) 0.05 % external solution Apply to scalp twice daily as needed for itching/irritation  50 mL  0  . clobetasol (TEMOVATE) 0.05 % ointment Apply topically 2 (two) times daily as needed.  60 g  0  . Clobetasol Propionate 0.05 % shampoo Use as directed  118 mL  0  . fluticasone (FLONASE) 50 MCG/ACT nasal spray Place 2 sprays into the nose daily. In each nostril  16 g  12  . omeprazole (PRILOSEC) 20 MG capsule Take 20 mg by mouth 2 (two) times daily.        . valACYclovir (VALTREX) 500 MG tablet Take 1 tablet (500 mg total) by mouth 2 (two) times daily.  20 tablet  3   The PMH, PSH, Social History, Family History, Medications, and allergies have been reviewed in [facility], and have been updated if relevant.  OBJECTIVE:  BP 140/90  Pulse 80  Temp(Src) 97.8 F (36.6 C) (Oral)  Wt 206 lb (93.441 kg)  LMP 01/20/2012  She appears well, afebrile. Skin:  Multiple non fluctuant small abscess around bra line and panty line.  None are draining, two under bra are surrounded by small area of warmth and erythema  ASSESSMENT:  Hidradentitis  PLAN:  Discussed course and treatment of hidradenitis and that is often a difficult issue to treat. Long term antibiotics not helpful but given that she does have some areas that appear infected, will treat with 10 day course of doxycycline. Advised minimizing shaving when possible and other preventative practices that may help. The patient indicates understanding of these issues and agrees with the plan.

## 2012-03-07 ENCOUNTER — Other Ambulatory Visit: Payer: Self-pay | Admitting: *Deleted

## 2012-03-07 MED ORDER — CLOBETASOL PROPIONATE 0.05 % EX SHAM: MEDICATED_SHAMPOO | CUTANEOUS | Status: DC

## 2012-03-07 NOTE — Telephone Encounter (Signed)
Patient advised via message left on cell phone voicemail, Rx sent to pharmacy.

## 2012-03-13 ENCOUNTER — Other Ambulatory Visit: Payer: Self-pay | Admitting: Family Medicine

## 2012-05-05 ENCOUNTER — Other Ambulatory Visit: Payer: Self-pay | Admitting: *Deleted

## 2012-05-05 NOTE — Telephone Encounter (Signed)
I tried to call pt back to get more information regarding her request for antibiotic, which came through the call a nurse line.  Recording on phone says she is unavailable.

## 2012-05-08 NOTE — Telephone Encounter (Signed)
Noted  

## 2012-05-12 ENCOUNTER — Other Ambulatory Visit: Payer: Self-pay | Admitting: Family Medicine

## 2012-05-12 NOTE — Telephone Encounter (Signed)
Please call pt re refill for Doxycycline.

## 2012-08-04 ENCOUNTER — Encounter: Payer: Self-pay | Admitting: Family Medicine

## 2012-08-04 ENCOUNTER — Ambulatory Visit: Payer: Managed Care, Other (non HMO) | Admitting: Family Medicine

## 2012-08-04 ENCOUNTER — Ambulatory Visit (INDEPENDENT_AMBULATORY_CARE_PROVIDER_SITE_OTHER): Payer: Managed Care, Other (non HMO) | Admitting: Family Medicine

## 2012-08-04 VITALS — BP 128/80 | HR 68 | Temp 98.0°F | Wt 198.2 lb

## 2012-08-04 DIAGNOSIS — R109 Unspecified abdominal pain: Secondary | ICD-10-CM

## 2012-08-04 DIAGNOSIS — N898 Other specified noninflammatory disorders of vagina: Secondary | ICD-10-CM | POA: Insufficient documentation

## 2012-08-04 DIAGNOSIS — H5710 Ocular pain, unspecified eye: Secondary | ICD-10-CM

## 2012-08-04 DIAGNOSIS — H5712 Ocular pain, left eye: Secondary | ICD-10-CM

## 2012-08-04 LAB — POCT URINALYSIS DIPSTICK
Bilirubin, UA: NEGATIVE
Blood, UA: NEGATIVE
Ketones, UA: NEGATIVE
pH, UA: 6

## 2012-08-04 LAB — POCT WET PREP (WET MOUNT): Clue Cells Wet Prep Whiff POC: NEGATIVE

## 2012-08-04 MED ORDER — FLUCONAZOLE 150 MG PO TABS: 150.0000 mg | ORAL_TABLET | Freq: Once | ORAL | Status: AC

## 2012-08-04 MED ORDER — ERYTHROMYCIN 5 MG/GM OP OINT: TOPICAL_OINTMENT | Freq: Two times a day (BID) | OPHTHALMIC | Status: AC

## 2012-08-04 NOTE — Assessment & Plan Note (Signed)
Anticipate beginnings of left upper eyelid stye/chalazion.  Discussed this.  Treat with lubricating eye drops and erythromycin ointment. No evidence of foreign body today. No evidence of corneal abrasion/ulcer Discussed if not improving as expected, or any worsening, to update [place] for referral to ophthalmologist.

## 2012-08-04 NOTE — Progress Notes (Signed)
  Subjective:    Patient ID: Beverly Moore, female    DOB: [birth year], 31 y.o.   MRN: [id number]  HPI CC: vag discharge, check left eye  Last week felt pain in right lower back described as achey feeling that lasted 1 day - then this week started noticing worsening discharge/vaginal irritation.  2d ago felt vaginal itching/irritation.  White thicker discharge.  Occasional sharp RLQ abd pain with voiding.  No rashes.  H/o boils in past - dx recently with hydradenitis.   No new lotions, products.  Uses unscented soaps.  This morning when awoke, felt irritation in left eye - eye was red and woke her up from sleep.  Painful to move eye, close eye.  Present for last few hours.  No problems when went to sleep last night.  No fevers, nausea/vomiting, hematuria, urgency, frequency. Smoking - 1/2 + ppd.  Has chantix at drugstore on hold. LMP 07/10/2012.  Past Medical History  Diagnosis Date  . BV (bacterial vaginosis)     recurrent  . Absence of menstruation   . Acute bronchitis   . Cellulitis and abscess of unspecified site   . Dysphagia, unspecified   . Genital herpes, unspecified   . Other acute sinusitis   . Other psoriasis   . Anal fissure   . Screening for lipoid disorders   . Tobacco use disorder      Review of Systems Per HPI    Objective:   Physical Exam  Nursing note and vitals reviewed. Constitutional: She appears well-developed and well-nourished. No distress.  HENT:  Head: Normocephalic and atraumatic.  Mouth/Throat: Oropharynx is clear and moist. No oropharyngeal exudate.  Eyes: Pupils are equal, round, and reactive to light. No foreign bodies found. Right conjunctiva is injected. Right conjunctiva has no hemorrhage. Left conjunctiva is not injected. Left conjunctiva has no hemorrhage.       Mild conjunctival/bulbar erythema - swollen erythematous area present left upper medial eyelid, swelling as well No pain with eye movement.  No periorbital swelling  Abdominal:  Soft. Bowel sounds are normal. She exhibits no distension and no mass. There is tenderness (R sided abd pain). There is no rebound and no guarding.  Genitourinary: Uterus normal. Pelvic exam was performed with patient supine. No labial fusion. There is no rash, tenderness, lesion or injury on the right labia. There is no rash, tenderness, lesion or injury on the left labia. Cervix exhibits no motion tenderness, no discharge and no friability. Right adnexum displays no mass, no tenderness and no fullness. Left adnexum displays no mass, no tenderness and no fullness. No erythema, tenderness or bleeding around the vagina. No foreign body around the vagina. No signs of injury around the vagina. Vaginal discharge found.  Musculoskeletal: She exhibits no edema.  Skin: Skin is warm and dry. No rash noted.  Psychiatric: She has a normal mood and affect.       Assessment & Plan:  Eye irrigated, fluorescein test negative for corneal abrasion.

## 2012-08-04 NOTE — Patient Instructions (Addendum)
I think you have stye of left upper eyelid causing irritation - treat with lubricating eye drops several times a day to avoid eye injury.  If worsening, please let us know.  I've also sent in antibiotic ointment to use on eye. For vaginal discharge - looks overall clear.  Treat with course of diflucan for possible yeast infection.  If not better, let us know. Good to see you today, call us with questions or if not improving as expected.  Chalazion A chalazion is a swelling or hard lump on the eyelid caused by a blocked oil gland. Chalazions may occur on the upper or the lower eyelid.  CAUSES  Oil gland in the eyelid becomes blocked. SYMPTOMS   Swelling or hard lump on the eyelid. This lump may make it hard to see out of the eye.   The swelling may spread to areas around the eye.  TREATMENT   Although some chalazions disappear by themselves in 1 or 2 months, some chalazions may need to be removed.   Medicines to treat an infection may be required.  HOME CARE INSTRUCTIONS   Wash your hands often and dry them with a clean towel. Do not touch the chalazion.   Apply heat to the eyelid several times a day for 10 minutes to help ease discomfort and bring any yellowish white fluid (pus) to the surface. One way to apply heat to a chalazion is to use the handle of a metal spoon.   Hold the handle under hot water until it is hot, and then wrap the handle in paper towels so that the heat can come through without burning your skin.   Hold the wrapped handle against the chalazion and reheat the spoon handle as needed.   Apply heat in this fashion for 10 minutes, 4 times per day.   Return to your caregiver to have the pus removed if it does not break (rupture) on its own.   Do not try to remove the pus yourself by squeezing the chalazion or sticking it with a pin or needle.   Only take over-the-counter or prescription medicines for pain, discomfort, or fever as directed by your caregiver.  SEEK  IMMEDIATE MEDICAL CARE IF:   You have pain in your eye.   Your vision changes.   The chalazion does not go away.   The chalazion becomes painful, red, or swollen, grows larger, or does not start to disappear after 2 weeks.  MAKE SURE YOU:   Understand these instructions.   Will watch your condition.   Will get help right away if you are not doing well or get worse.  Document Released: 12/03/2000 Document Revised: 11/25/2011 Document Reviewed: 03/23/2010 [facility] Patient Information 2012 [place], [place].

## 2012-08-04 NOTE — Assessment & Plan Note (Signed)
UA normal, wet prep overall normal. Given clinical findings of white discharge, treat as yeast infection with course of diflucan. If not better, update [place] for further evaluation.

## 2012-09-18 ENCOUNTER — Other Ambulatory Visit: Payer: Self-pay | Admitting: Family Medicine

## 2012-09-25 ENCOUNTER — Encounter: Payer: Self-pay | Admitting: Family Medicine

## 2012-09-25 ENCOUNTER — Ambulatory Visit (INDEPENDENT_AMBULATORY_CARE_PROVIDER_SITE_OTHER): Payer: Managed Care, Other (non HMO) | Admitting: Family Medicine

## 2012-09-25 VITALS — BP 126/70 | HR 67 | Temp 98.4°F | Wt 195.8 lb

## 2012-09-25 DIAGNOSIS — IMO0001 Reserved for inherently not codable concepts without codable children: Secondary | ICD-10-CM

## 2012-09-25 DIAGNOSIS — N390 Urinary tract infection, site not specified: Secondary | ICD-10-CM | POA: Insufficient documentation

## 2012-09-25 DIAGNOSIS — R350 Frequency of micturition: Secondary | ICD-10-CM

## 2012-09-25 LAB — POCT URINALYSIS DIPSTICK
Ketones, UA: NEGATIVE
Spec Grav, UA: >=1.03
pH, UA: 6

## 2012-09-25 MED ORDER — CIPROFLOXACIN HCL 250 MG PO TABS: 250.0000 mg | ORAL_TABLET | Freq: Two times a day (BID) | ORAL | Status: DC

## 2012-09-25 NOTE — Assessment & Plan Note (Signed)
No sign of pyelo.  Cipro, ucx, fluids, f/u prn.  Nontoxic.

## 2012-09-25 NOTE — Patient Instructions (Addendum)
Drink plenty of water and start the antibiotics today.  We'll contact you with your lab report.  Take care.   

## 2012-09-25 NOTE — Progress Notes (Signed)
Dysuria: yes, pain in abd with urination duration of symptoms: a few days abdominal pain: lower abd pain Fevers:no  back pain: some lower back pain, mild Vomiting:no  U/a d/w pt.   Meds, vitals, and allergies reviewed.   ROS: See HPI.  Otherwise negative.    GEN: nad, alert and oriented HEENT: mucous membranes moist NECK: supple CV: rrr.  PULM: ctab, no inc wob ABD: soft, +bs, suprapubic area not tender EXT: no edema BACK: no CVA pain

## 2012-09-27 LAB — URINE CULTURE

## 2012-12-08 ENCOUNTER — Other Ambulatory Visit: Payer: Self-pay | Admitting: Family Medicine

## 2012-12-16 ENCOUNTER — Other Ambulatory Visit: Payer: Self-pay | Admitting: Family Medicine

## 2013-12-26 ENCOUNTER — Ambulatory Visit (INDEPENDENT_AMBULATORY_CARE_PROVIDER_SITE_OTHER): Payer: Managed Care, Other (non HMO) | Admitting: Family Medicine

## 2013-12-26 ENCOUNTER — Encounter: Payer: Self-pay | Admitting: Family Medicine

## 2013-12-26 VITALS — BP 120/66 | HR 70 | Temp 98.2°F | Ht 69.0 in | Wt 192.5 lb

## 2013-12-26 DIAGNOSIS — H6692 Otitis media, unspecified, left ear: Secondary | ICD-10-CM

## 2013-12-26 DIAGNOSIS — H6690 Otitis media, unspecified, unspecified ear: Secondary | ICD-10-CM

## 2013-12-26 MED ORDER — AMOXICILLIN 875 MG PO TABS: 875.0000 mg | ORAL_TABLET | Freq: Two times a day (BID) | ORAL | Status: AC

## 2013-12-26 NOTE — Patient Instructions (Signed)
Good to see you. Take amoxicillin as directed- 1 tablet twice daily x 10 days.  Continue Mucinex as needed.  Call me if not improving over next 7 days.

## 2013-12-26 NOTE — Progress Notes (Signed)
SUBJECTIVE:  Beverly Moore is a 33 y.o. female who complains of coryza, congestion, sneezing, dry cough and ear pain for 10 days. She denies a history of anorexia, chest pain and chills and denies a history of asthma. Patient admits to smoke cigarettes.   Patient Active Problem List   Diagnosis Date Noted  . UTI (lower urinary tract infection) 09/25/2012  . Vaginal Discharge 08/04/2012  . Pain, eye, left 08/04/2012  . Urinary pain 02/07/2012  . Finger laceration 09/24/2011  . Allergic rhinitis, cause unspecified 07/02/2011  . Headache 03/19/2011  . TOBACCO ABUSE 06/11/2010  . AMENORRHEA 04/17/2010  . DYSPHAGIA UNSPECIFIED 04/17/2010  . GENITAL HERPES 01/16/2010  . CELLULITIS AND ABSCESS OF UNSPECIFIED SITE 01/16/2010  . RECTAL FISSURE 10/24/2009  . PSORIASIS 10/24/2009   Past Medical History  Diagnosis Date  . BV (bacterial vaginosis)     recurrent  . Absence of menstruation   . Acute bronchitis   . Cellulitis and abscess of unspecified site   . Dysphagia, unspecified(787.20)   . Genital herpes, unspecified   . Other acute sinusitis   . Other psoriasis   . Anal fissure   . Screening for lipoid disorders   . Tobacco use disorder    No past surgical history on file. History  Substance Use Topics  . Smoking status: Current Every Day Smoker -- 0.50 packs/day    Types: Cigarettes  . Smokeless tobacco: Not on file     Comment: has the chantix but hasn't committed to stopping smoking yet  . Alcohol Use: Yes     Comment: Occasional   Family History  Problem Relation Age of Onset  . Hypertension Father   . Diabetes Father   . Cancer Father     blood  . Hypertension Mother   . Colon cancer  50    grandfather   No Known Allergies Current Outpatient Prescriptions on File Prior to Visit  Medication Sig Dispense Refill  . azelastine (ASTELIN) 137 MCG/SPRAY nasal spray Place 2 sprays into the nose 2 (two) times daily. Use in each nostril as directed       . clobetasol  (OLUX) 0.05 % topical foam Apply topically 2 (two) times daily. Apply to scalp for itching and irritation       . clobetasol (TEMOVATE) 0.05 % external solution APPLY TO SCALP TWICE DAILY FOR ITCHING/IRRITATION  50 mL  0  . Clobetasol Propionate 0.05 % shampoo Use as directed  118 mL  0  . Clobetasol Propionate 0.05 % shampoo USE AS DIRECTED  118 mL  1  . omeprazole (PRILOSEC) 20 MG capsule Take 20 mg by mouth 2 (two) times daily.        . valACYclovir (VALTREX) 500 MG tablet TAKE 1 TABLET (500 MG TOTAL) BY MOUTH 2 (TWO) TIMES DAILY.  20 tablet  2  . albuterol (PROAIR HFA) 108 (90 BASE) MCG/ACT inhaler Inhale 2 puffs into the lungs every 6 (six) hours as needed for wheezing.  1 Inhaler  1   No current facility-administered medications on file prior to visit.   The PMH, PSH, Social History, Family History, Medications, and allergies have been reviewed in [facility], and have been updated if relevant.  OBJECTIVE: BP 120/66  Pulse 70  Temp(Src) 98.2 F (36.8 C) (Oral)  Ht 5\' 9"  (1.753 m)  Wt 192 lb 8 oz (87.317 kg)  BMI 28.41 kg/m2  SpO2 96%  LMP 12/07/2013  She appears well, vital signs are as noted. Left TM dull, buldgin  Throat and pharynx normal.  Neck supple. No adenopathy in the neck. Nose is congested. Sinuses non tender. The chest is clear, without wheezes or rales.  ASSESSMENT:  otitis media  PLAN: Amoxicillin 875 mg twice daily x 10 days. Symptomatic therapy suggested: push fluids, rest and return office visit prn if symptoms persist or worsen.  Call or return to clinic prn if these symptoms worsen or fail to improve as anticipated.

## 2013-12-26 NOTE — Progress Notes (Signed)
Pre-visit discussion using our clinic review tool. No additional management support is needed unless otherwise documented below in the visit note.  

## 2013-12-27 ENCOUNTER — Telehealth: Payer: Self-pay | Admitting: Family Medicine

## 2013-12-27 NOTE — Telephone Encounter (Signed)
Relevant patient education mailed to patient.  

## 2014-02-04 ENCOUNTER — Ambulatory Visit (INDEPENDENT_AMBULATORY_CARE_PROVIDER_SITE_OTHER): Payer: Managed Care, Other (non HMO) | Admitting: Internal Medicine

## 2014-02-04 ENCOUNTER — Encounter: Payer: Self-pay | Admitting: Internal Medicine

## 2014-02-04 VITALS — BP 136/88 | HR 86 | Temp 98.2°F | Wt 197.2 lb

## 2014-02-04 DIAGNOSIS — J329 Chronic sinusitis, unspecified: Secondary | ICD-10-CM

## 2014-02-04 DIAGNOSIS — B9789 Other viral agents as the cause of diseases classified elsewhere: Secondary | ICD-10-CM

## 2014-02-04 NOTE — Progress Notes (Signed)
HPI  Pt presents to the clinic today with c/o headache, nasal congestion, runny nose, sore throat and ear pain. She reports this started about 3 days ago. She is blowing blood tinged clear mucous out of her nose. She has taken OTC Alka Seltzer Cold and sinus and flonase with some relief. She has a history of allergies but denies breathing problems. She does not smoke.  Review of Systems    Past Medical History  Diagnosis Date  . BV (bacterial vaginosis)     recurrent  . Absence of menstruation   . Acute bronchitis   . Cellulitis and abscess of unspecified site   . Dysphagia, unspecified(787.20)   . Genital herpes, unspecified   . Other acute sinusitis   . Other psoriasis   . Anal fissure   . Screening for lipoid disorders   . Tobacco use disorder     Family History  Problem Relation Age of Onset  . Hypertension Father   . Diabetes Father   . Cancer Father     blood  . Hypertension Mother   . Colon cancer  50    grandfather    History   Social History  . Marital Status: Married    Spouse Name: N/A    Number of Children: N/A  . Years of Education: N/A   Occupational History  . in [employer] for [employer]    Social History Main Topics  . Smoking status: Current Every Day Smoker -- 0.50 packs/day    Types: Cigarettes  . Smokeless tobacco: Not on file     Comment: has the chantix but hasn't committed to stopping smoking yet  . Alcohol Use: Yes     Comment: Occasional  . Drug Use: No  . Sexual Activity: Not on file   Other Topics Concern  . Not on file   Social History Narrative   [place] ago. Married in 10/10. [place] in [place]    No Known Allergies   Constitutional: Positive headache, fatigue. Denies fevers or abrupt weight changes.  HEENT:  Positive eye pain, pressure behind the eyes, facial pain, nasal congestion and sore throat. Denies eye redness, ear pain, ringing in the ears, wax buildup, runny nose or bloody  nose. Respiratory: Denies cough, difficulty breathing or shortness of breath.  Cardiovascular: Denies chest pain, chest tightness, palpitations or swelling in the hands or feet.   No other specific complaints in a complete review of systems (except as listed in HPI above).  Objective:   BP 136/88  Pulse 86  Temp(Src) 98.2 F (36.8 C) (Oral)  Wt 197 lb 4 oz (89.472 kg)  SpO2 98%   General: Appears her stated age, well developed, well nourished in NAD. HEENT: Head: normal shape and size, no sinus tenderness noted; Eyes: sclera white, no icterus, conjunctiva pink, PERRLA and EOMs intact; Ears: Tm's gray and intact, normal light reflex; Nose: mucosa pink and moist, septum midline; Throat/Mouth: + PND. Teeth present, mucosa pink and moist, no exudate noted, no lesions or ulcerations noted.  Neck: Neck supple, trachea midline. No massses, lumps or thyromegaly present.  Cardiovascular: Normal rate and rhythm. S1,S2 noted.  No murmur, rubs or gallops noted. No JVD or BLE edema. No carotid bruits noted. Pulmonary/Chest: Normal effort and positive vesicular breath sounds. No respiratory distress. No wheezes, rales or ronchi noted.      Assessment & Plan:   Acute viral sinusitis  Can use a Neti Pot which can be purchased from  your local drug store. Continue Flonase, start Allegra or Zyrtec OTC Ibuprofen and salt water gargles for sore throat  RTC as needed or if symptoms persist.

## 2014-02-04 NOTE — Patient Instructions (Addendum)

## 2014-02-04 NOTE — Progress Notes (Signed)
Pre-visit discussion using our clinic review tool. No additional management support is needed unless otherwise documented below in the visit note.  

## 2014-02-06 ENCOUNTER — Telehealth: Payer: Self-pay | Admitting: Family Medicine

## 2014-02-06 ENCOUNTER — Other Ambulatory Visit: Payer: Self-pay | Admitting: Internal Medicine

## 2014-02-06 MED ORDER — AMOXICILLIN 875 MG PO TABS: 875.0000 mg | ORAL_TABLET | Freq: Two times a day (BID) | ORAL | Status: DC

## 2014-02-06 NOTE — Telephone Encounter (Signed)
Relevant patient education mailed to patient.  

## 2014-02-06 NOTE — Telephone Encounter (Signed)
Pt is aware.  

## 2014-02-06 NOTE — Telephone Encounter (Signed)
Please advise--after reading previous message below

## 2014-02-06 NOTE — Telephone Encounter (Signed)
Sent in RX for amoxicillin

## 2014-02-06 NOTE — Telephone Encounter (Signed)
Pt saw Rene KocherRegina and says she is not any better and Rene KocherRegina told her to call back in if it does not get better, I asked pt to schedule appt but pt says she does not want to come back in and pay another co-pay. Pt uses [facility] on [address].

## 2014-07-18 ENCOUNTER — Ambulatory Visit: Payer: Managed Care, Other (non HMO) | Admitting: Internal Medicine

## 2014-08-16 ENCOUNTER — Encounter: Payer: Self-pay | Admitting: Internal Medicine

## 2014-08-16 ENCOUNTER — Ambulatory Visit (INDEPENDENT_AMBULATORY_CARE_PROVIDER_SITE_OTHER): Payer: Managed Care, Other (non HMO) | Admitting: Internal Medicine

## 2014-08-16 VITALS — BP 116/60 | HR 64 | Temp 98.4°F | Wt 194.0 lb

## 2014-08-16 DIAGNOSIS — B009 Herpesviral infection, unspecified: Secondary | ICD-10-CM

## 2014-08-16 MED ORDER — VALACYCLOVIR HCL 500 MG PO TABS: ORAL_TABLET | ORAL | Status: DC

## 2014-08-16 NOTE — Patient Instructions (Signed)

## 2014-08-16 NOTE — Progress Notes (Signed)
Subjective:    Patient ID: Beverly Moore, female    DOB: 07/16/1981, 33 y.o.   MRN: [id number]  HPI  Pt presents to the clinic today with c/o a rash on her chin. She noticed this 1 week ago. She reports it started out as a small bump and has grown to the size of a quarter. The area is tender. She has seen fluid drain from the area. She has put antibiotic ointment on it without relief. She denies fever.  Review of Systems      Past Medical History  Diagnosis Date  . BV (bacterial vaginosis)     recurrent  . Absence of menstruation   . Acute bronchitis   . Cellulitis and abscess of unspecified site   . Dysphagia, unspecified(787.20)   . Genital herpes, unspecified   . Other acute sinusitis   . Other psoriasis   . Anal fissure   . Screening for lipoid disorders   . Tobacco use disorder     Current Outpatient Prescriptions  Medication Sig Dispense Refill  . Adalimumab (HUMIRA) 10 MG/0.2ML PSKT Inject into the skin.      Marland Kitchen azelastine (ASTELIN) 137 MCG/SPRAY nasal spray Place 2 sprays into the nose 2 (two) times daily. Use in each nostril as directed       . clobetasol (TEMOVATE) 0.05 % external solution APPLY TO SCALP TWICE DAILY FOR ITCHING/IRRITATION  50 mL  0  . omeprazole (PRILOSEC) 20 MG capsule Take 20 mg by mouth 2 (two) times daily.        . valACYclovir (VALTREX) 500 MG tablet TAKE 1 TABLET (500 MG TOTAL) BY MOUTH 2 (TWO) TIMES DAILY.  20 tablet  2  . albuterol (PROAIR HFA) 108 (90 BASE) MCG/ACT inhaler Inhale 2 puffs into the lungs every 6 (six) hours as needed for wheezing.  1 Inhaler  1   No current facility-administered medications for this visit.    No Known Allergies  Family History  Problem Relation Age of Onset  . Hypertension Father   . Diabetes Father   . Cancer Father     blood  . Hypertension Mother   . Colon cancer  50    grandfather    History   Social History  . Marital Status: Married    Spouse Name: N/A    Number of Children: N/A  .  Years of Education: N/A   Occupational History  . in [employer] for [employer]    Social History Main Topics  . Smoking status: Current Every Day Smoker -- 0.50 packs/day    Types: Cigarettes  . Smokeless tobacco: Not on file     Comment: has the chantix but hasn't committed to stopping smoking yet  . Alcohol Use: Yes     Comment: Occasional  . Drug Use: No  . Sexual Activity: Not on file   Other Topics Concern  . Not on file   Social History Narrative   Relocated from [place] 3 years ago. Married in 10/10. Lives in [place].     Constitutional: Denies fever, malaise, fatigue, headache or abrupt weight changes.   Skin: Pt reports rash on chin. Denies lesions or ulcercations.    No other specific complaints in a complete review of systems (except as listed in HPI above).  Objective:   Physical Exam   Pulse 64  Temp(Src) 98.4 F (36.9 C) (Oral)  Wt 194 lb (87.998 kg)  SpO2 98% Wt Readings from Last 3 Encounters:  08/16/14 194  lb (87.998 kg)  02/04/14 197 lb 4 oz (89.472 kg)  12/26/13 192 lb 8 oz (87.317 kg)    General: Appears her stated age, well developed, well nourished in NAD. Skin: Clustered, scabbed over herpetic lesions noted on the chin. Cardiovascular: Normal rate and rhythm. S1,S2 noted.  No murmur, rubs or gallops noted. No JVD or BLE edema. No carotid bruits noted. Pulmonary/Chest: Normal effort and positive vesicular breath sounds. No respiratory distress. No wheezes, rales or ronchi noted.    BMET    Component Value Date/Time   NA 140 11/18/2009 0943   K 4.1 11/18/2009 0943   CL 106 11/18/2009 0943   CO2 28 11/18/2009 0943   GLUCOSE 89 11/18/2009 0943   BUN 13 11/18/2009 0943   CREATININE 0.8 11/18/2009 0943   CALCIUM 9.8 11/18/2009 0943   GFRNONAA 90.26 11/18/2009 0943    Lipid Panel     Component Value Date/Time   CHOL 157 11/18/2009 0943   TRIG 75.0 11/18/2009 0943   HDL 64.80 11/18/2009 0943   CHOLHDL 2 11/18/2009 0943   VLDL 15.0  11/18/2009 0943   LDLCALC 77 11/18/2009 0943    CBC No results found for this basename: wbc, rbc, hgb, hct, plt, mcv, mch, mchc, rdw, neutrabs, lymphsabs, monoabs, eosabs, basosabs    Hgb A1C No results found for this basename: HGBA1C         Assessment & Plan:   Herpetic lesion of face:  Will refill valtrex- take 2 tabs BID x 3 days Ok to continue neosporin  RTC as needed or if symptoms persist or worsen

## 2014-09-16 ENCOUNTER — Encounter: Payer: Self-pay | Admitting: Internal Medicine

## 2014-09-16 ENCOUNTER — Ambulatory Visit (INDEPENDENT_AMBULATORY_CARE_PROVIDER_SITE_OTHER): Payer: Managed Care, Other (non HMO) | Admitting: Internal Medicine

## 2014-09-16 VITALS — BP 112/76 | HR 76 | Temp 98.6°F | Wt 198.0 lb

## 2014-09-16 DIAGNOSIS — J309 Allergic rhinitis, unspecified: Secondary | ICD-10-CM

## 2014-09-16 NOTE — Progress Notes (Signed)
HPI  Pt presents to the clinic today with c/o headache, sinus pressure, ear fullness and nasal congestion. She reports this started 3 days ago. The headache is located across her forehead. She reports that it feels like a throbbing sensation. She is not blowing any thick mucous out of her nose. She denies fever, chills or body aches. She has tried some excedrin and allergy medication without any relief. She has no history of allergies or breathing problems. She has had sick contacts. She does smoke.  Review of Systems    Past Medical History  Diagnosis Date  . BV (bacterial vaginosis)     recurrent  . Absence of menstruation   . Acute bronchitis   . Cellulitis and abscess of unspecified site   . Dysphagia, unspecified(787.20)   . Genital herpes, unspecified   . Other acute sinusitis   . Other psoriasis   . Anal fissure   . Screening for lipoid disorders   . Tobacco use disorder     Family History  Problem Relation Age of Onset  . Hypertension Father   . Diabetes Father   . Cancer Father     blood  . Hypertension Mother   . Colon cancer  50    grandfather    History   Social History  . Marital Status: Married    Spouse Name: N/A    Number of Children: N/A  . Years of Education: N/A   Occupational History  . in [employer] for [employer]    Social History Main Topics  . Smoking status: Current Every Day Smoker -- 0.50 packs/day    Types: Cigarettes  . Smokeless tobacco: Not on file     Comment: has the chantix but hasn't committed to stopping smoking yet  . Alcohol Use: Yes     Comment: Occasional  . Drug Use: No  . Sexual Activity: Not on file   Other Topics Concern  . Not on file   Social History Narrative   Relocated from [place] 3 years ago. Married in 10/10. Lives in [place].    No Known Allergies   Constitutional: Positive headache. Denies fatigue, fever or abrupt weight changes.  HEENT:  Positive facial pain, nasal congestion and sore throat.  Denies eye redness, ear pain, ringing in the ears, wax buildup, runny nose or bloody nose. Respiratory:  Denies cough, difficulty breathing or shortness of breath.  Cardiovascular: Denies chest pain, chest tightness, palpitations or swelling in the hands or feet.   No other specific complaints in a complete review of systems (except as listed in HPI above).  Objective:   BP 112/76  Pulse 76  Temp(Src) 98.6 F (37 C) (Oral)  Wt 198 lb (89.812 kg)  SpO2 98%  LMP 09/10/2014  General: Appears her stated age, well developed, well nourished in NAD. HEENT: Head: normal shape and size, no sinus tenderness noted; Ears: Tm's gray and intact, normal light reflex; Nose: mucosa pink and moist, septum midline; Throat/Mouth:  Teeth present, mucosa pink and moist, no exudate noted, no lesions or ulcerations noted.  Cardiovascular: Normal rate and rhythm. S1,S2 noted.  No murmur, rubs or gallops noted. No JVD or BLE edema. No carotid bruits noted. Pulmonary/Chest: Normal effort and positive vesicular breath sounds. No respiratory distress. No wheezes, rales or ronchi noted.      Assessment & Plan:   Allergic Rhinitis  Can use a Neti Pot which can be purchased from your local drug store. Flonase 2 sprays each nostril for  3 days and then as needed. Continue OTC allergy medication Watch for fever, chills, body aches, worsening facial pain or colored mucous  RTC as needed or if symptoms persist.

## 2014-09-16 NOTE — Patient Instructions (Addendum)

## 2014-09-16 NOTE — Progress Notes (Signed)
Pre visit review using our clinic review tool, if applicable. No additional management support is needed unless otherwise documented below in the visit note. 

## 2014-10-03 ENCOUNTER — Ambulatory Visit (INDEPENDENT_AMBULATORY_CARE_PROVIDER_SITE_OTHER): Payer: Managed Care, Other (non HMO) | Admitting: Family Medicine

## 2014-10-03 ENCOUNTER — Telehealth: Payer: Self-pay

## 2014-10-03 ENCOUNTER — Encounter: Payer: Self-pay | Admitting: Family Medicine

## 2014-10-03 VITALS — BP 126/68 | HR 80 | Temp 98.1°F | Wt 197.5 lb

## 2014-10-03 DIAGNOSIS — N390 Urinary tract infection, site not specified: Secondary | ICD-10-CM

## 2014-10-03 DIAGNOSIS — R350 Frequency of micturition: Secondary | ICD-10-CM

## 2014-10-03 LAB — POCT URINALYSIS DIPSTICK
BILIRUBIN UA: NEGATIVE
GLUCOSE UA: NEGATIVE
Ketones, UA: NEGATIVE
Nitrite, UA: POSITIVE
PH UA: 6
SPEC GRAV UA: 1.015
UROBILINOGEN UA: 0.2

## 2014-10-03 MED ORDER — CIPROFLOXACIN HCL 500 MG PO TABS: 500.0000 mg | ORAL_TABLET | Freq: Two times a day (BID) | ORAL | Status: AC

## 2014-10-03 MED ORDER — OMEPRAZOLE 20 MG PO CPDR: 20.0000 mg | DELAYED_RELEASE_CAPSULE | Freq: Two times a day (BID) | ORAL | Status: DC

## 2014-10-03 MED ORDER — FLUCONAZOLE 150 MG PO TABS: 150.0000 mg | ORAL_TABLET | Freq: Once | ORAL | Status: DC

## 2014-10-03 NOTE — Telephone Encounter (Signed)
Agree. To [facility] to eval for severe migraine and worsening UTI.

## 2014-10-03 NOTE — Patient Instructions (Signed)
Good to see you. Please drink more fluids. Take cipro as directed- 1 tablet twice daily x 7 days.  Call us tomorrow with an update.

## 2014-10-03 NOTE — Progress Notes (Signed)
Pre visit review using our clinic review tool, if applicable. No additional management support is needed unless otherwise documented below in the visit note. 

## 2014-10-03 NOTE — Telephone Encounter (Signed)
Pt was seen this morning; pt said she is feeling much worse now, fever 99.9, chills, severe migraine h/a. Pt understands med given this morning will not work this fast but pt states she feels much worse now and wants to know if should go to ED. Dr Reece AgarG said if pt running fever and feeling worse pt could go to UC for eval. Pt voiced understanding and will go to UC.

## 2014-10-03 NOTE — Progress Notes (Signed)
SUBJECTIVE: Beverly DodrillJennifer Pujol is a 33 y.o. female who complains of urinary frequency, urgency and dysuria x 3 days, with right sided flank pain. Denies fever, chills, or abnormal vaginal discharge or bleeding.  She is on humira.  Current Outpatient Prescriptions on File Prior to Visit  Medication Sig Dispense Refill  . Adalimumab (HUMIRA) 10 MG/0.2ML PSKT Inject into the skin.      Marland Kitchen. albuterol (PROAIR HFA) 108 (90 BASE) MCG/ACT inhaler Inhale 2 puffs into the lungs every 6 (six) hours as needed for wheezing.  1 Inhaler  1  . azelastine (ASTELIN) 137 MCG/SPRAY nasal spray Place 2 sprays into the nose 2 (two) times daily. Use in each nostril as directed       . clobetasol (TEMOVATE) 0.05 % external solution APPLY TO SCALP TWICE DAILY FOR ITCHING/IRRITATION  50 mL  0  . valACYclovir (VALTREX) 500 MG tablet TAKE 1 TABLET (500 MG TOTAL) BY MOUTH 2 (TWO) TIMES DAILY.  30 tablet  2   No current facility-administered medications on file prior to visit.    No Known Allergies  Past Medical History  Diagnosis Date  . BV (bacterial vaginosis)     recurrent  . Absence of menstruation   . Acute bronchitis   . Cellulitis and abscess of unspecified site   . Dysphagia, unspecified(787.20)   . Genital herpes, unspecified   . Other acute sinusitis   . Other psoriasis   . Anal fissure   . Screening for lipoid disorders   . Tobacco use disorder     No past surgical history on file.  Family History  Problem Relation Age of Onset  . Hypertension Father   . Diabetes Father   . Cancer Father     blood  . Hypertension Mother   . Colon cancer  50    grandfather    History   Social History  . Marital Status: Married    Spouse Name: N/A    Number of Children: N/A  . Years of Education: N/A   Occupational History  . in [employer] for [employer]    Social History Main Topics  . Smoking status: Current Every Day Smoker -- 0.50 packs/day    Types: Cigarettes  . Smokeless tobacco: Not on file   Comment: has the chantix but hasn't committed to stopping smoking yet  . Alcohol Use: Yes     Comment: Occasional  . Drug Use: No  . Sexual Activity: Not on file   Other Topics Concern  . Not on file   Social History Narrative   [place] ago. Mar[place] in 10/10. [place] in [place].   The PMH, PSH, Social History, Family History, Medications, and allergies [facility], and have been updated if relevant.   OBJECTIVE:  BP 126/68  Pulse 80  Temp(Src) 98.1 F (36.7 C) (Oral)  Wt 197 lb 8 oz (89.585 kg)  SpO2 97%  LMP 09/10/2014  Appears well, in no apparent distress.  Vital signs are normal. The abdomen is soft without tenderness, guarding, mass, rebound or organomegaly. + right  CVA tenderness, no inguinal adenopathy noted. Urine dipstick shows positive for RBC's, positive for nitrates and positive for leukocytes.   ASSESSMENT: UTI complicated with evidence of pyelonephritis  PLAN: Treatment per orders - also push fluids, may use Pyridium OTC prn. cipro 500 mg twice daily x 7 days.  Call us tomorrow or return to clinic prn if these symptoms worsen or fail to improve  as anticipated.

## 2014-10-04 ENCOUNTER — Telehealth: Payer: Self-pay | Admitting: Family Medicine

## 2014-10-04 ENCOUNTER — Encounter (HOSPITAL_COMMUNITY): Payer: Self-pay | Admitting: Radiology

## 2014-10-04 ENCOUNTER — Emergency Department (HOSPITAL_COMMUNITY)
Admission: EM | Admit: 2014-10-04 | Discharge: 2014-10-04 | Disposition: A | Discharge status: Home / Self Care | Payer: Managed Care, Other (non HMO) | Attending: Emergency Medicine | Admitting: Emergency Medicine

## 2014-10-04 ENCOUNTER — Emergency Department (HOSPITAL_COMMUNITY): Payer: Managed Care, Other (non HMO)

## 2014-10-04 DIAGNOSIS — N12 Tubulo-interstitial nephritis, not specified as acute or chronic: Secondary | ICD-10-CM | POA: Diagnosis not present

## 2014-10-04 DIAGNOSIS — R51 Headache: Secondary | ICD-10-CM | POA: Insufficient documentation

## 2014-10-04 DIAGNOSIS — Z8709 Personal history of other diseases of the respiratory system: Secondary | ICD-10-CM | POA: Diagnosis not present

## 2014-10-04 DIAGNOSIS — Z3202 Encounter for pregnancy test, result negative: Secondary | ICD-10-CM | POA: Diagnosis not present

## 2014-10-04 DIAGNOSIS — Z79899 Other long term (current) drug therapy: Secondary | ICD-10-CM | POA: Insufficient documentation

## 2014-10-04 DIAGNOSIS — Z7951 Long term (current) use of inhaled steroids: Secondary | ICD-10-CM | POA: Insufficient documentation

## 2014-10-04 DIAGNOSIS — Z8742 Personal history of other diseases of the female genital tract: Secondary | ICD-10-CM | POA: Diagnosis not present

## 2014-10-04 DIAGNOSIS — Z720 Tobacco use: Secondary | ICD-10-CM | POA: Insufficient documentation

## 2014-10-04 DIAGNOSIS — Z8619 Personal history of other infectious and parasitic diseases: Secondary | ICD-10-CM | POA: Diagnosis not present

## 2014-10-04 DIAGNOSIS — Z872 Personal history of diseases of the skin and subcutaneous tissue: Secondary | ICD-10-CM | POA: Insufficient documentation

## 2014-10-04 DIAGNOSIS — R1031 Right lower quadrant pain: Secondary | ICD-10-CM | POA: Diagnosis present

## 2014-10-04 LAB — CBC WITH DIFFERENTIAL/PLATELET
Basophils Absolute: 0 10*3/uL (ref 0.0–0.1)
Basophils Relative: 0 % (ref 0–1)
Eosinophils Absolute: 0 10*3/uL (ref 0.0–0.7)
Eosinophils Relative: 0 % (ref 0–5)
HEMATOCRIT: 43.1 % (ref 36.0–46.0)
HEMOGLOBIN: 15.1 g/dL — AB (ref 12.0–15.0)
LYMPHS ABS: 1 10*3/uL (ref 0.7–4.0)
Lymphocytes Relative: 9 % — ABNORMAL LOW (ref 12–46)
MCH: 33.7 pg (ref 26.0–34.0)
MCHC: 35 g/dL (ref 30.0–36.0)
MCV: 96.2 fL (ref 78.0–100.0)
MONO ABS: 0.8 10*3/uL (ref 0.1–1.0)
MONOS PCT: 7 % (ref 3–12)
NEUTROS ABS: 10.2 10*3/uL — AB (ref 1.7–7.7)
NEUTROS PCT: 84 % — AB (ref 43–77)
Platelets: 154 10*3/uL (ref 150–400)
RBC: 4.48 MIL/uL (ref 3.87–5.11)
RDW: 12.9 % (ref 11.5–15.5)
WBC: 12.1 10*3/uL — ABNORMAL HIGH (ref 4.0–10.5)

## 2014-10-04 LAB — URINALYSIS, ROUTINE W REFLEX MICROSCOPIC
BILIRUBIN URINE: NEGATIVE
Glucose, UA: NEGATIVE mg/dL
Ketones, ur: 15 mg/dL — AB
NITRITE: NEGATIVE
Protein, ur: 30 mg/dL — AB
SPECIFIC GRAVITY, URINE: 1.012 (ref 1.005–1.030)
UROBILINOGEN UA: 0.2 mg/dL (ref 0.0–1.0)
pH: 5.5 (ref 5.0–8.0)

## 2014-10-04 LAB — COMPREHENSIVE METABOLIC PANEL
ALT: 12 U/L (ref 0–35)
ANION GAP: 14 (ref 5–15)
AST: 17 U/L (ref 0–37)
Albumin: 3.8 g/dL (ref 3.5–5.2)
Alkaline Phosphatase: 56 U/L (ref 39–117)
BUN: 10 mg/dL (ref 6–23)
CALCIUM: 9.3 mg/dL (ref 8.4–10.5)
CO2: 22 mEq/L (ref 19–32)
CREATININE: 0.84 mg/dL (ref 0.50–1.10)
Chloride: 98 mEq/L (ref 96–112)
GFR calc Af Amer: >90 mL/min (ref >90)
GFR calc non Af Amer: >90 mL/min (ref >90)
GLUCOSE: 134 mg/dL — AB (ref 70–99)
Potassium: 3.9 mEq/L (ref 3.7–5.3)
Sodium: 134 mEq/L — ABNORMAL LOW (ref 137–147)
TOTAL PROTEIN: 8.3 g/dL (ref 6.0–8.3)
Total Bilirubin: 0.5 mg/dL (ref 0.3–1.2)

## 2014-10-04 LAB — WET PREP, GENITAL
Clue Cells Wet Prep HPF POC: NONE SEEN
TRICH WET PREP: NONE SEEN
Yeast Wet Prep HPF POC: NONE SEEN

## 2014-10-04 LAB — URINE MICROSCOPIC-ADD ON

## 2014-10-04 LAB — POC URINE PREG, ED: Preg Test, Ur: NEGATIVE

## 2014-10-04 MED ORDER — HYDROCODONE-ACETAMINOPHEN 5-325 MG PO TABS: 1.0000 | ORAL_TABLET | ORAL | Status: DC | PRN

## 2014-10-04 MED ORDER — ONDANSETRON HCL 4 MG/2ML IJ SOLN
4.0000 mg | Freq: Once | INTRAMUSCULAR | Status: AC
  Administered 2014-10-04: 4 mg via INTRAVENOUS
  Filled 2014-10-04: qty 2

## 2014-10-04 MED ORDER — SULFAMETHOXAZOLE-TRIMETHOPRIM 800-160 MG PO TABS: 1.0000 | ORAL_TABLET | Freq: Two times a day (BID) | ORAL | Status: DC

## 2014-10-04 MED ORDER — SODIUM CHLORIDE 0.9 % IV BOLUS (SEPSIS)
1000.0000 mL | Freq: Once | INTRAVENOUS | Status: AC
  Administered 2014-10-04: 1000 mL via INTRAVENOUS

## 2014-10-04 MED ORDER — IOHEXOL 300 MG/ML  SOLN
100.0000 mL | Freq: Once | INTRAMUSCULAR | Status: AC | PRN
  Administered 2014-10-04: 100 mL via INTRAVENOUS

## 2014-10-04 MED ORDER — MORPHINE SULFATE 4 MG/ML IJ SOLN
4.0000 mg | Freq: Once | INTRAMUSCULAR | Status: AC
  Administered 2014-10-04: 4 mg via INTRAVENOUS
  Filled 2014-10-04: qty 1

## 2014-10-04 MED ORDER — IOHEXOL 300 MG/ML  SOLN
25.0000 mL | INTRAMUSCULAR | Status: AC
  Administered 2014-10-04: 25 mL via ORAL

## 2014-10-04 MED ORDER — DEXTROSE 5 % IV SOLN
1.0000 g | Freq: Once | INTRAVENOUS | Status: AC
  Administered 2014-10-04: 1 g via INTRAVENOUS
  Filled 2014-10-04: qty 10

## 2014-10-04 NOTE — ED Notes (Signed)
Patient returned from CT

## 2014-10-04 NOTE — ED Notes (Signed)
Was seen at her reg dr yesterday and  ucc today for abd pain h/a and flank pain was dx w/ UTI also having back pain  Had labs drawn ans wbc is 13.4

## 2014-10-04 NOTE — ED Provider Notes (Signed)
CSN: [id number]     Arrival date & time 10/04/14  1257 History   First MD Initiated Contact with Patient 10/04/14 1706     Chief Complaint  Patient presents with  . Abdominal Pain  . Headache     (Consider location/radiation/quality/duration/timing/severity/associated sxs/prior Treatment) The history is provided by the patient and medical records. No language interpreter was used.    Ryla Cauthon is a 33 y.o. female  with a hx of BV, anal fissure presents to the Emergency Department complaining of gradual, persistent, progressively worsening right flank, RLQ abd and suprapubic abd pain onset 10 days ago. Pt reports symptoms first began as low back pain 10 days ago, worse on the right.  She reports RLQ and suprapubic pain began only with urination 5 days ago and progressed to a constant pain in the last 3 days.  Patient reports urinary frequency and urgency. She reports she saw her regular doctor yesterday who diagnosed her with a UTI and begin her onset breath. She reports no improvement and therefore presented to an urgent care today.  She reports she was sent from the urgent care to the emergency department for CT scan. Associated symptoms include right lower quadrant abdominal pain, urinary symptoms.  Nothing makes it better and nothing makes it worse.  Pt denies fever, chills, headache, neck pain, chest pain, shortness of breath, nausea, vomiting, diarrhea, weakness, dizziness, syncope..     Past Medical History  Diagnosis Date  . BV (bacterial vaginosis)     recurrent  . Absence of menstruation   . Acute bronchitis   . Cellulitis and abscess of unspecified site   . Dysphagia, unspecified(787.20)   . Genital herpes, unspecified   . Other acute sinusitis   . Other psoriasis   . Anal fissure   . Screening for lipoid disorders   . Tobacco use disorder    No past surgical history on file. Family History  Problem Relation Age of Onset  . Hypertension Father   . Diabetes Father    . Cancer Father     blood  . Hypertension Mother   . Colon cancer  50    grandfather   History  Substance Use Topics  . Smoking status: Current Every Day Smoker -- 0.50 packs/day    Types: Cigarettes  . Smokeless tobacco: Not on file     Comment: has the chantix but hasn't committed to stopping smoking yet  . Alcohol Use: Yes     Comment: Occasional   OB History   Grav Para Term Preterm Abortions TAB SAB Ect Mult Living                 Review of Systems  Constitutional: Negative for fever, diaphoresis, appetite change and fatigue.  Respiratory: Negative for shortness of breath.   Cardiovascular: Negative for chest pain.  Gastrointestinal: Positive for abdominal pain. Negative for nausea, vomiting, diarrhea, constipation and blood in stool.  Genitourinary: Positive for urgency, frequency and flank pain. Negative for dysuria, hematuria and difficulty urinating.  Musculoskeletal: Negative for back pain.  Skin: Negative for rash.  Neurological: Negative for headaches.  All other systems reviewed and are negative.     Allergies  Review of patient's allergies indicates no known allergies.  Home Medications   Prior to Admission medications   Medication Sig Start Date End Date Taking? Authorizing Provider  Adalimumab (HUMIRA) 10 MG/0.2ML PSKT Inject 50 mg into the skin See admin instructions. Takes every other week   Yes Historical Provider,  MD  albuterol (PROAIR HFA) 108 (90 BASE) MCG/ACT inhaler Inhale 2 puffs into the lungs every 6 (six) hours as needed for wheezing. 09/27/11 10/04/14 Yes Dianne Dunalia M Aron, MD  aspirin-acetaminophen-caffeine (EXCEDRIN MIGRAINE) 5208085468250-250-65 MG per tablet Take 2-3 tablets by mouth every 6 (six) hours as needed for headache.   Yes Historical Provider, MD  cetirizine (ZYRTEC) 10 MG tablet Take 10 mg by mouth daily.   Yes Historical Provider, MD  ciprofloxacin (CIPRO) 500 MG tablet Take 1 tablet (500 mg total) by mouth 2 (two) times daily. 10/03/14  10/10/14 Yes Dianne Dunalia M Aron, MD  fluticasone (FLONASE) 50 MCG/ACT nasal spray Place 2 sprays into both nostrils daily.   Yes Historical Provider, MD  omeprazole (PRILOSEC) 20 MG capsule Take 1 capsule (20 mg total) by mouth 2 (two) times daily. 10/03/14  Yes Dianne Dunalia M Aron, MD  OVER THE COUNTER MEDICATION Take 1 application by mouth daily as needed (for psoriasis).   Yes Historical Provider, MD  HYDROcodone-acetaminophen (NORCO/VICODIN) 5-325 MG per tablet Take 1-2 tablets by mouth every 4 (four) hours as needed for moderate pain or severe pain. 10/04/14   Harvie Morua, PA-C  sulfamethoxazole-trimethoprim (SEPTRA DS) 800-160 MG per tablet Take 1 tablet by mouth every 12 (twelve) hours. 10/04/14   Saprina Chuong, PA-C   BP 139/78  Pulse 92  Temp(Src) 99.8 F (37.7 C) (Oral)  Resp 18  SpO2 100%  LMP 09/10/2014 Physical Exam  Nursing note and vitals reviewed. Constitutional: She appears well-developed and well-nourished. No distress.  Awake, alert, nontoxic appearance  HENT:  Head: Normocephalic and atraumatic.  Mouth/Throat: Oropharynx is clear and moist. No oropharyngeal exudate.  Eyes: Conjunctivae are normal. No scleral icterus.  Neck: Normal range of motion. Neck supple.  Cardiovascular: Normal rate, regular rhythm, normal heart sounds and intact distal pulses.   Pulmonary/Chest: Effort normal and breath sounds normal. No respiratory distress. She has no wheezes.  Equal chest expansion  Abdominal: Soft. Bowel sounds are normal. She exhibits no distension and no mass. There is tenderness in the right lower quadrant. There is rebound, guarding ( voluntary) and CVA tenderness (right). There is no rigidity.  Negative Rovsing sign  Musculoskeletal: Normal range of motion. She exhibits no edema.  Neurological: She is alert.  Speech is clear and goal oriented Moves extremities without ataxia  Skin: Skin is warm and dry. No rash noted. She is not diaphoretic.  Psychiatric: She has  a normal mood and affect.    ED Course  Procedures (including critical care time) Labs Review Labs Reviewed  WET PREP, GENITAL - Abnormal; Notable for the following:    WBC, Wet Prep HPF POC FEW (*)    All other components within normal limits  CBC WITH DIFFERENTIAL - Abnormal; Notable for the following:    WBC 12.1 (*)    Hemoglobin 15.1 (*)    Neutrophils Relative % 84 (*)    Neutro Abs 10.2 (*)    Lymphocytes Relative 9 (*)    All other components within normal limits  COMPREHENSIVE METABOLIC PANEL - Abnormal; Notable for the following:    Sodium 134 (*)    Glucose, Bld 134 (*)    All other components within normal limits  URINALYSIS, ROUTINE W REFLEX MICROSCOPIC - Abnormal; Notable for the following:    APPearance CLOUDY (*)    Hgb urine dipstick MODERATE (*)    Ketones, ur 15 (*)    Protein, ur 30 (*)    Leukocytes, UA TRACE (*)  All other components within normal limits  URINE MICROSCOPIC-ADD ON - Abnormal; Notable for the following:    Squamous Epithelial / LPF MANY (*)    Bacteria, UA MANY (*)    All other components within normal limits  URINE CULTURE  GC/CHLAMYDIA PROBE AMP  HIV ANTIBODY (ROUTINE TESTING)  POC URINE PREG, ED    Imaging Review Ct Abdomen Pelvis W Contrast  10/04/2014   CLINICAL DATA:  Right side abdominal pain for 5 days. Elevated white blood cell count. Initial encounter.  EXAM: CT ABDOMEN AND PELVIS WITH CONTRAST  TECHNIQUE: Multidetector CT imaging of the abdomen and pelvis was performed using the standard protocol following bolus administration of intravenous contrast.  CONTRAST:  100 mL OMNIPAQUE IOHEXOL 300 MG/ML  SOLN  COMPARISON:  None.  FINDINGS: Mild dependent atelectasis is seen in the lung bases. No pleural or pericardial effusion.  Stranding about the right ureter is identified. Subtle areas of decreased cortical enhancement are identified in the mid and lower pole of the right kidney worrisome for pyelonephritis. 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 There is extensive scarring in the lower pole of the left kidney likely due to reflux. Scar is also identified in the upper pole of the right kidney and likely due to vesicoureteral reflux. The patient appears to have a single right renal collecting system. The urinary bladder is distended but otherwise unremarkable.  The gallbladder, spleen, adrenal glands and pancreas are unremarkable. A low attenuating lesion in the left ovary measures 3.6 cm in diameter has an irregular border suggestive of an involuting cyst. There is a small volume of free pelvic fluid. The appendix is visualized and appears normal with gas identified in the appendix. The stomach and small and large bowel are unremarkable. No focal bony abnormality is identified.  IMPRESSION: Findings highly suggestive of chronic bilateral vesicoureteral reflux. 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  Subtle decreased cortical enhancement in the midpole of the right kidney is worrisome for pyelonephritis. Stranding about the right ureter also suggests infectious or inflammatory process.  Findings compatible with an involuting left ovarian cyst.  Negative for appendicitis.   Electronically Signed   By: Drusilla Kannerhomas  Dalessio M.D.   On: 10/04/2014 19:36     EKG Interpretation None      MDM   Final diagnoses:  Pyelonephritis   Latrelle DodrillJennifer Modica presents with right flank, right lower quadrant and suprapubic abdominal tenderness with right-sided CVA tenderness.  Urinalysis with white blood cells, many bacteria, trace leukocytes and moderate hemoglobin. Concern for possible renal colic versus colitis versus appendicitis versus a pyelonephritis as patient is also tender in the right lower quadrant.  With a psychosis of 12.1 but labs otherwise a reassuring.  Pelvic exam without significant vaginal discharge patient denies.  9:09 PM Ct scan with right-sided pyelonephritis  and findings suggestive chronic bilateral vesicoureteral reflux. No evidence of nephrolithiasis. Patient with adequate pain control here in the emergency department.  Her vitals are stable without hypotension.  Low-grade fever to 100.3 patient without nausea and vomiting.  No urine culture sent, therefore one will be sent today.  Patient has been taking Cipro however antibiotic will be changed to Bactrim.  She is without tachycardia or intractable vomiting.  Patient is well-appearing in believe she is stable for discharge home with close outpatient followup.  Patient given Rocephin here in the emergency department.  I have personally reviewed patient's vitals, nursing note and any pertinent labs or  imaging.  I performed an undressed physical exam.    It has been determined that no acute conditions requiring further emergency intervention are present at this time. The patient/guardian have been advised of the diagnosis and plan. I reviewed all labs and imaging including any potential incidental findings (chronic bilateral vesicoureteral reflux). We have discussed signs and symptoms that warrant return to the ED, such as high fevers, worsening abdominal pain or intractable vomiting.  Patient/guardian has voiced understanding and agreed to follow-up with the PCP or specialist in 3 days.  She is also to followup with [facility].  Vital signs are stable at discharge.   BP 139/78  Pulse 92  Temp(Src) 99.8 F (37.7 C) (Oral)  Resp 18  SpO2 100%  LMP 09/10/2014        Dierdre Forth, PA-C 10/04/14 2233  Dierdre Forth, PA-C 10/04/14 2233

## 2014-10-04 NOTE — Telephone Encounter (Signed)
emmi mailed  °

## 2014-10-04 NOTE — Discharge Instructions (Signed)
1. Medications: bactrim, usual home medications 2. Treatment: rest, drink plenty of fluids,  3. Follow Up: Please followup with your primary doctor in 3 days and urology as soon as possible for discussion of your diagnoses and further evaluation after today's visit;  Return to the ER for persistent vomiting, worsening pain, high fevers or other concerning symptoms    Pyelonephritis, Adult Pyelonephritis is a kidney infection. A kidney infection can happen quickly, or it can last for a long time. HOME CARE   Take your medicine (antibiotics) as told. Finish it even if you start to feel better.  Keep all doctor visits as told.  Drink enough fluids to keep your pee (urine) clear or pale yellow.  Only take medicine as told by your doctor. GET HELP RIGHT AWAY IF:   You have a fever or lasting symptoms for more than 2-3 days.  You have a fever and your symptoms suddenly get worse.  You cannot take your medicine or drink fluids as told.  You have chills and shaking.  You feel very weak or pass out (faint).  You do not feel better after 2 days. MAKE SURE YOU:  Understand these instructions.  Will watch your condition.  Will get help right away if you are not doing well or get worse. Document Released: 01/13/2005 Document Revised: 06/06/2012 Document Reviewed: 05/26/2011 [facility] Patient Information 2015 [place], [place]. This information is not intended to replace advice given to you by your health care provider. Make sure you discuss any questions you have with your health care provider.

## 2014-10-05 LAB — URINE CULTURE
Colony Count: NO GROWTH
Culture: NO GROWTH

## 2014-10-05 LAB — GC/CHLAMYDIA PROBE AMP
CT Probe RNA: NEGATIVE
GC PROBE AMP APTIMA: NEGATIVE

## 2014-10-05 LAB — HIV ANTIBODY (ROUTINE TESTING W REFLEX): HIV 1&2 Ab, 4th Generation: NONREACTIVE

## 2014-10-05 NOTE — ED Provider Notes (Signed)
Medical screening examination/treatment/procedure(s) were performed by non-physician practitioner and as supervising physician I was immediately available for consultation/collaboration.  Quaran Kedzierski J. Liany Mumpower, MD 10/05/14 1706 

## 2014-10-06 LAB — URINE CULTURE

## 2014-10-11 ENCOUNTER — Encounter: Payer: Self-pay | Admitting: *Deleted

## 2015-05-13 ENCOUNTER — Ambulatory Visit (INDEPENDENT_AMBULATORY_CARE_PROVIDER_SITE_OTHER): Payer: Managed Care, Other (non HMO) | Admitting: Family Medicine

## 2015-05-13 ENCOUNTER — Encounter: Payer: Self-pay | Admitting: Family Medicine

## 2015-05-13 VITALS — BP 120/78 | HR 66 | Temp 98.3°F | Wt 193.0 lb

## 2015-05-13 DIAGNOSIS — J019 Acute sinusitis, unspecified: Secondary | ICD-10-CM

## 2015-05-13 MED ORDER — AMOXICILLIN-POT CLAVULANATE 875-125 MG PO TABS: 1.0000 | ORAL_TABLET | Freq: Two times a day (BID) | ORAL | Status: AC

## 2015-05-13 NOTE — Progress Notes (Signed)
SUBJECTIVE:  Beverly Moore is a 34 y.o. female smoker with h/o allergic rhinitis who complains of coryza, congestion, sneezing, post nasal drip, productive cough and bilateral sinus pain for 14 days. She denies a history of anorexia, chest pain, fevers, myalgias and shortness of breath.   Taking zyrtec and using flonase daily.     Current Outpatient Prescriptions on File Prior to Visit  Medication Sig Dispense Refill  . Adalimumab (HUMIRA) 10 MG/0.2ML PSKT Inject 50 mg into the skin See admin instructions. Takes every other week    . aspirin-acetaminophen-caffeine (EXCEDRIN MIGRAINE) 250-250-65 MG per tablet Take 2-3 tablets by mouth every 6 (six) hours as needed for headache.    . cetirizine (ZYRTEC) 10 MG tablet Take 10 mg by mouth daily.    . fluticasone (FLONASE) 50 MCG/ACT nasal spray Place 2 sprays into both nostrils daily.    Marland Kitchen omeprazole (PRILOSEC) 20 MG capsule Take 1 capsule (20 mg total) by mouth 2 (two) times daily. 60 capsule 3  . OVER THE COUNTER MEDICATION Take 1 application by mouth daily as needed (for psoriasis).    Marland Kitchen albuterol (PROAIR HFA) 108 (90 BASE) MCG/ACT inhaler Inhale 2 puffs into the lungs every 6 (six) hours as needed for wheezing. 1 Inhaler 1   No current facility-administered medications on file prior to visit.    No Known Allergies  Past Medical History  Diagnosis Date  . BV (bacterial vaginosis)     recurrent  . Absence of menstruation   . Acute bronchitis   . Cellulitis and abscess of unspecified site   . Dysphagia, unspecified(787.20)   . Genital herpes, unspecified   . Other acute sinusitis   . Other psoriasis   . Anal fissure   . Screening for lipoid disorders   . Tobacco use disorder     History reviewed. No pertinent past surgical history.  Family History  Problem Relation Age of Onset  . Hypertension Father   . Diabetes Father   . Cancer Father     blood  . Hypertension Mother   . Colon cancer  50    grandfather    History    Social History  . Marital Status: Married    Spouse Name: N/A  . Number of Children: N/A  . Years of Education: N/A   Occupational History  . in [employer] for [employer]    Social History Main Topics  . Smoking status: Current Every Day Smoker -- 0.50 packs/day    Types: Cigarettes  . Smokeless tobacco: Not on file     Comment: has the chantix but hasn't committed to stopping smoking yet  . Alcohol Use: Yes     Comment: Occasional  . Drug Use: No  . Sexual Activity: Not on file   Other Topics Concern  . Not on file   Social History Narrative   Relocated from [place] 3 years ago. Married in 10/10. Lives in [place].   The PMH, PSH, Social History, Family History, Medications, and allergies have been reviewed in [facility], and have been updated if relevant.  OBJECTIVE: BP 120/78 mmHg  Pulse 66  Temp(Src) 98.3 F (36.8 C) (Oral)  Wt 193 lb (87.544 kg)  SpO2 95%  LMP 04/25/2015  She appears well, vital signs are as noted. Ears normal.  Throat and pharynx normal.  Neck supple. No adenopathy in the neck. Nose is congested. Sinuses tender throughout The chest is clear, without wheezes or rales.  ASSESSMENT:  sinusitis and allergic rhinitis  PLAN: Given duration and progression of symptoms, will treat for bacterial sinusitis with Augmentin, trial of nasocort instead of flonase.  Symptomatic therapy suggested: push fluids, rest and return office visit prn if symptoms persist or worsen. Call or return to clinic prn if these symptoms worsen or fail to improve as anticipated.

## 2015-05-13 NOTE — Patient Instructions (Signed)
Take antibiotic as directed.  Drink lots of fluids.    Treat sympotmatically with Mucinex, nasal saline irrigation, and Tylenol/Ibuprofen. Continue zyrtec. Try over the counter nasocort-start with 2 sprays per nostril per day...and then try to taper to 1 spray per nostril once symptoms improve.   You can use warm compresses.     Call if not improving as expected in 5-7 days.

## 2015-05-13 NOTE — Progress Notes (Signed)
Pre visit review using our clinic review tool, if applicable. No additional management support is needed unless otherwise documented below in the visit note. 

## 2015-05-29 ENCOUNTER — Telehealth: Payer: Self-pay

## 2015-05-29 DIAGNOSIS — T3695XA Adverse effect of unspecified systemic antibiotic, initial encounter: Principal | ICD-10-CM

## 2015-05-29 DIAGNOSIS — B379 Candidiasis, unspecified: Secondary | ICD-10-CM

## 2015-05-29 MED ORDER — FLUCONAZOLE 150 MG PO TABS: 150.0000 mg | ORAL_TABLET | Freq: Once | ORAL | Status: DC

## 2015-05-29 NOTE — Telephone Encounter (Signed)
Pt left v/m; pt was seen 05/13/15 and has finished abx; pt thinks now has yeast infection and request med to [facility] [address]. Pt request cb.

## 2015-05-29 NOTE — Telephone Encounter (Signed)
Recent treatment for sinusitis. Sent diflucan 150 mg tablet. Voicemail left for patient.

## 2015-06-20 ENCOUNTER — Ambulatory Visit (INDEPENDENT_AMBULATORY_CARE_PROVIDER_SITE_OTHER): Payer: Managed Care, Other (non HMO) | Admitting: Internal Medicine

## 2015-06-20 ENCOUNTER — Encounter: Payer: Self-pay | Admitting: Internal Medicine

## 2015-06-20 VITALS — BP 138/86 | HR 75 | Temp 97.6°F | Resp 16 | Wt 188.0 lb

## 2015-06-20 DIAGNOSIS — S91351A Open bite, right foot, initial encounter: Secondary | ICD-10-CM | POA: Diagnosis not present

## 2015-06-20 MED ORDER — CEPHALEXIN 250 MG PO CAPS
250.0000 mg | ORAL_CAPSULE | Freq: Four times a day (QID) | ORAL | Status: DC
Start: 2015-06-20 — End: 2015-12-12

## 2015-06-20 NOTE — Progress Notes (Signed)
   Subjective:    Patient ID: Beverly Moore, female    DOB: 1981/01/04, 34 y.o.   MRN: [id number]  HPI Yesterday atapproximately 5 PM she was in her camper when she felt something bite her on the right foot. She didn't see the actual agent. An hour later she noted a vesicle on top of the foot. She had associated sensation of feeling hot and associated diffuse abdominal pain. She did take her PPI without significant benefit. She also took Benadryl. The vesicle did rupture with production of clear liquid. She cleaned the lesion with soap and water and applied Neosporin and a Band-Aid.  She's had some constant abdominal discomfort since but no projectile vomiting, fever, chills, or sweats. She's had some mild frontal headache.  Her tetanus shot is up-to-date by history   Review of Systems  No associated itchy, watery eyes.  She does have rhinitis symptoms unrelated to the present illness.  Swelling of the lips or tongue denied.  Shortness of breath, wheezing, or cough absent.  No rash or urticaria noted.  Purulence absent.  Diarrhea not present.     Objective:   Physical Exam  Pertinent or positive findings include:  There is a 3 x 3 mm denuded vesicle over the dorsum of the right foot proximal to a tattoo. 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   General appearance :adequately nourished; in no distress.  Eyes: No conjunctival inflammation or scleral icterus is present.  Oral exam:  Lips and gums are healthy appearing.There is no oropharyngeal erythema or exudate noted. Dental hygiene is good.  Heart:  Normal rate and regular rhythm. S1 and S2 normal without gallop, murmur, click, rub or other extra sounds    Lungs:Chest clear to auscultation; no wheezes, rhonchi,rales ,or rubs present.No increased work of breathing.   Abdomen: bowel sounds normal, soft and non-tender without masses, organomegaly or hernias noted.  No guarding or  rebound.   Vascular : all pulses equal ; no bruits present.  Skin:Warm & dry ; no tenting or jaundice   Lymphatic: No lymphadenopathy is noted about the head, neck, axilla   Neuro: Strength, tone & DTRs normal.         Assessment & Plan:  #1, possible spider bite; no evidence of cellulitis or abscess at this time.

## 2015-06-20 NOTE — Progress Notes (Signed)
Pre visit review using our clinic review tool, if applicable. No additional management support is needed unless otherwise documented below in the visit note. 

## 2015-06-20 NOTE — Patient Instructions (Signed)
Dip gauze in  sterile saline and applied to the wound twice a day. Cover the wound with Telfa , non stick dressing  without any antibiotic ointment. The saline can be purchased at the drugstore or you can make your own .Boil cup of salt in a gallon of water. Store mixture  in a clean container.Report Warning  signs as discussed (red streaks, pus, fever, increasing pain). Fill the  prescription for antibiotic if fever or other warning signs  appear in the next 48-72 hours.   Plain Mucinex (NOT D) for thick secretions ;force NON dairy fluids .   Nasal cleansing in the shower as discussed with lather of mild shampoo.After 10 seconds wash off lather while  exhaling through nostrils. Make sure that all residual soap is removed to prevent irritation.  Fluticasone 1 spray in each nostril twice a day as needed. Use the "crossover" technique into opposite nostril spraying toward opposite ear @ 45 degree angle, not straight up into nostril.  Use a Neti pot daily only  as needed for significant sinus congestion; going from open side to congested side . Plain Allegra (NOT D )  160 daily , Loratidine 10 mg , OR Zyrtec 10 mg @ bedtime  as needed for itchy eyes & sneezing.

## 2015-12-12 ENCOUNTER — Ambulatory Visit (INDEPENDENT_AMBULATORY_CARE_PROVIDER_SITE_OTHER): Payer: Managed Care, Other (non HMO) | Admitting: Internal Medicine

## 2015-12-12 ENCOUNTER — Encounter: Payer: Self-pay | Admitting: Internal Medicine

## 2015-12-12 VITALS — BP 120/68 | HR 73 | Temp 98.1°F | Wt 186.0 lb

## 2015-12-12 DIAGNOSIS — F17200 Nicotine dependence, unspecified, uncomplicated: Secondary | ICD-10-CM

## 2015-12-12 DIAGNOSIS — F329 Major depressive disorder, single episode, unspecified: Secondary | ICD-10-CM

## 2015-12-12 DIAGNOSIS — J069 Acute upper respiratory infection, unspecified: Secondary | ICD-10-CM | POA: Diagnosis not present

## 2015-12-12 DIAGNOSIS — Z720 Tobacco use: Secondary | ICD-10-CM | POA: Diagnosis not present

## 2015-12-12 DIAGNOSIS — R4589 Other symptoms and signs involving emotional state: Secondary | ICD-10-CM

## 2015-12-12 MED ORDER — VARENICLINE TARTRATE 0.5 MG X 11 & 1 MG X 42 PO MISC: ORAL | Status: DC

## 2015-12-12 MED ORDER — HYDROCODONE-HOMATROPINE 5-1.5 MG/5ML PO SYRP: 5.0000 mL | ORAL_SOLUTION | Freq: Three times a day (TID) | ORAL | Status: DC | PRN

## 2015-12-12 MED ORDER — ESCITALOPRAM OXALATE 10 MG PO TABS: 10.0000 mg | ORAL_TABLET | Freq: Every day | ORAL | Status: DC

## 2015-12-12 MED ORDER — AZITHROMYCIN 250 MG PO TABS: ORAL_TABLET | ORAL | Status: DC

## 2015-12-12 NOTE — Progress Notes (Signed)
Pre visit review using our clinic review tool, if applicable. No additional management support is needed unless otherwise documented below in the visit note. 

## 2015-12-12 NOTE — Patient Instructions (Signed)
Smoking Cessation, Tips for Success If you are ready to quit smoking, congratulations! You have chosen to help yourself be healthier. Cigarettes bring nicotine, tar, carbon monoxide, and other irritants into your body. Your lungs, heart, and blood vessels will be able to work better without these poisons. There are many different ways to quit smoking. Nicotine gum, nicotine patches, a nicotine inhaler, or nicotine nasal spray can help with physical craving. Hypnosis, support groups, and medicines help break the habit of smoking. WHAT THINGS CAN I DO TO MAKE QUITTING EASIER?  Here are some tips to help you quit for good:  Pick a date when you will quit smoking completely. Tell all of your friends and family about your plan to quit on that date.  Do not try to slowly cut down on the number of cigarettes you are smoking. Pick a quit date and quit smoking completely starting on that day.  Throw away all cigarettes.   Clean and remove all ashtrays from your home, work, and car.  On a card, write down your reasons for quitting. Carry the card with you and read it when you get the urge to smoke.  Cleanse your body of nicotine. Drink enough water and fluids to keep your urine clear or pale yellow. Do this after quitting to flush the nicotine from your body.  Learn to predict your moods. Do not let a bad situation be your excuse to have a cigarette. Some situations in your life might tempt you into wanting a cigarette.  Never have "just one" cigarette. It leads to wanting another and another. Remind yourself of your decision to quit.  Change habits associated with smoking. If you smoked while driving or when feeling stressed, try other activities to replace smoking. Stand up when drinking your coffee. Brush your teeth after eating. Sit in a different chair when you read the paper. Avoid alcohol while trying to quit, and try to drink fewer caffeinated beverages. Alcohol and caffeine may urge you to  smoke.  Avoid foods and drinks that can trigger a desire to smoke, such as sugary or spicy foods and alcohol.  Ask people who smoke not to smoke around you.  Have something planned to do right after eating or having a cup of coffee. For example, plan to take a walk or exercise.  Try a relaxation exercise to calm you down and decrease your stress. Remember, you may be tense and nervous for the first 2 weeks after you quit, but this will pass.  Find new activities to keep your hands busy. Play with a pen, coin, or rubber band. Doodle or draw things on paper.  Brush your teeth right after eating. This will help cut down on the craving for the taste of tobacco after meals. You can also try mouthwash.   Use oral substitutes in place of cigarettes. Try using lemon drops, carrots, cinnamon sticks, or chewing gum. Keep them handy so they are available when you have the urge to smoke.  When you have the urge to smoke, try deep breathing.  Designate your home as a nonsmoking area.  If you are a heavy smoker, ask your health care provider about a prescription for nicotine chewing gum. It can ease your withdrawal from nicotine.  Reward yourself. Set aside the cigarette money you save and buy yourself something nice.  Look for support from others. Join a support group or smoking cessation program. Ask someone at home or at work to help you with your plan   to quit smoking.  Always ask yourself, "Do I need this cigarette or is this just a reflex?" Tell yourself, "Today, I choose not to smoke," or "I do not want to smoke." You are reminding yourself of your decision to quit.  Do not replace cigarette smoking with electronic cigarettes (commonly called e-cigarettes). The safety of e-cigarettes is unknown, and some may contain harmful chemicals.  If you relapse, do not give up! Plan ahead and think about what you will do the next time you get the urge to smoke. HOW WILL I FEEL WHEN I QUIT SMOKING? You  may have symptoms of withdrawal because your body is used to nicotine (the addictive substance in cigarettes). You may crave cigarettes, be irritable, feel very hungry, cough often, get headaches, or have difficulty concentrating. The withdrawal symptoms are only temporary. They are strongest when you first quit but will go away within 10-14 days. When withdrawal symptoms occur, stay in control. Think about your reasons for quitting. Remind yourself that these are signs that your body is healing and getting used to being without cigarettes. Remember that withdrawal symptoms are easier to treat than the major diseases that smoking can cause.  Even after the withdrawal is over, expect periodic urges to smoke. However, these cravings are generally short lived and will go away whether you smoke or not. Do not smoke! WHAT RESOURCES ARE AVAILABLE TO HELP ME QUIT SMOKING? Your health care provider can direct you to community resources or hospitals for support, which may include:  Group support.  Education.  Hypnosis.  Therapy.   This information is not intended to replace advice given to you by your health care provider. Make sure you discuss any questions you have with your health care provider.   Document Released: 09/03/2004 Document Revised: 12/27/2014 Document Reviewed: 05/24/2013 Elsevier Interactive Patient Education 2016 [employer] Inc.  

## 2015-12-12 NOTE — Progress Notes (Signed)
HPI  Pt presents to the clinic today with c/o nasal congestion, cough and chest congestion. This started 1 week ago. She is not blowing anything out of her nose. The cough is nonproductive. She has had some mild shortness of breath. She denies fever but has had chills. She has tried Robitussin with minimal relief. She has no history of allergies or breathing problems/ She has had sick contacts. She does smoke.  She does also want to get a RX for Chantix. She is ready to quit smoking. She has taken Chantix in the past but reports she started feeling down as she cut back on smoking. Both her mother and her sister are on Lexapro, and she would like to try that for a few weeks before starting on Chantix. She has never been treated for anxiety or depression in the past. She denies SI/HI.  Review of Systems      Past Medical History  Diagnosis Date  . BV (bacterial vaginosis)     recurrent  . Absence of menstruation   . Acute bronchitis   . Cellulitis and abscess of unspecified site   . Dysphagia, unspecified(787.20)   . Genital herpes, unspecified   . Other acute sinusitis   . Other psoriasis   . Anal fissure   . Screening for lipoid disorders   . Tobacco use disorder     Family History  Problem Relation Age of Onset  . Hypertension Father   . Diabetes Father   . Cancer Father     blood  . Hypertension Mother   . Colon cancer  3550    grandfather    Social History   Social History  . Marital Status: Married    Spouse Name: N/A  . Number of Children: N/A  . Years of Education: N/A   Occupational History  . in [employer] for [employer]    Social History Main Topics  . Smoking status: Current Every Day Smoker -- 0.50 packs/day    Types: Cigarettes  . Smokeless tobacco: Never Used     Comment: has the chantix but hasn't committed to stopping smoking yet  . Alcohol Use: 0.0 oz/week    0 Standard drinks or equivalent per week     Comment: Occasional  . Drug Use: No  . Sexual  Activity: Not on file   Other Topics Concern  . Not on file   Social History Narrative   [place] Mar[place] in 10/10. [place] in [place]    No Known Allergies   Constitutional: Positive headache, fatigue. Denies fever or abrupt weight changes.  HEENT:  Positive nasal congestion, sore throat. Denies eye redness, eye pain, pressure behind the eyes, facial pain, nasal congestion, ear pain, ringing in the ears, wax buildup, runny nose or bloody nose. Respiratory: Positive cough. Denies difficulty breathing or shortness of breath.  Cardiovascular: Denies chest pain, chest tightness, palpitations or swelling in the hands or feet.  Psych: Pt reports feeling down. Denies anxiety, depression, SI/HI.  No other specific complaints in a complete review of systems (except as listed in HPI above).  Objective:   BP 120/68 mmHg  Pulse 73  Temp(Src) 98.1 F (36.7 C) (Tympanic)  Wt 186 lb (84.369 kg)  SpO2 97% Wt Readings from Last 3 Encounters:  12/12/15 186 lb (84.369 kg)  06/20/15 188 lb (85.276 kg)  05/13/15 193 lb (87.544 kg)     General: Appears her stated age, well developed, well nourished in NAD. HEENT:  Head: normal shape and size, no sinus tenderness noted; Eyes: sclera white, no icterus, conjunctiva pink; Ears: Tm's oink but intact, normal light reflex; Nose: mucosa pink and moist, septum midline; Throat/Mouth: + PND. Teeth present, mucosa erythematous and moist, no exudate noted, no lesions or ulcerations noted.  Neck: No cervical lymphadenopathy.  Cardiovascular: Normal rate and rhythm. S1,S2 noted.  No murmur, rubs or gallops noted.  Pulmonary/Chest: Normal effort and diminshed breath sounds. No respiratory distress. No wheezes, rales or ronchi noted.      Assessment & Plan:   Upper Respiratory Infection:  Get some rest and drink plenty of water Do salt water gargles for the sore throat eRx for Azithromax x 5 days Rx for Hycodan cough  syrup  Smoker, motivated to quit:  Rx for Chantix starting month pack Support offered today  Feeling down:  eRx for Lexapro 10 mg tab Start taking for 1 month before starting Chantix  Follow up with PCP in 6 weeks RTC as needed or if symptoms persist.

## 2016-01-22 ENCOUNTER — Encounter: Payer: Self-pay | Admitting: Family Medicine

## 2016-01-22 ENCOUNTER — Ambulatory Visit (INDEPENDENT_AMBULATORY_CARE_PROVIDER_SITE_OTHER): Payer: Managed Care, Other (non HMO) | Admitting: Family Medicine

## 2016-01-22 VITALS — BP 112/70 | HR 67 | Temp 97.9°F | Wt 186.0 lb

## 2016-01-22 DIAGNOSIS — Z23 Encounter for immunization: Secondary | ICD-10-CM | POA: Diagnosis not present

## 2016-01-22 DIAGNOSIS — M79675 Pain in left toe(s): Secondary | ICD-10-CM

## 2016-01-22 DIAGNOSIS — F17200 Nicotine dependence, unspecified, uncomplicated: Secondary | ICD-10-CM | POA: Diagnosis not present

## 2016-01-22 DIAGNOSIS — F329 Major depressive disorder, single episode, unspecified: Secondary | ICD-10-CM | POA: Diagnosis not present

## 2016-01-22 DIAGNOSIS — F32A Depression, unspecified: Secondary | ICD-10-CM | POA: Insufficient documentation

## 2016-01-22 LAB — URIC ACID: Uric Acid, Serum: 4.6 mg/dL (ref 2.4–7.0)

## 2016-01-22 NOTE — Assessment & Plan Note (Signed)
>  25 minutes spent in face to face time with patient, >50% spent in counselling or coordination of care discussing depression, tobacco abuse and toe pain.  Depression stable on current dose of lexapro.  No changes made to rx dose today.

## 2016-01-22 NOTE — Progress Notes (Signed)
Subjective:   Patient ID: Beverly Moore, female    DOB: 10-08-1981, 35 y.o.   MRN: [id number]  Beverly Moore is a pleasant 35 y.o. year old female who presents to clinic today with Follow-up  on 01/22/2016  HPI:  Saw Nicki Reaper one month ago for URI symptoms.  They also discussed her quitting smoking and how she had been feeling depressed since she had started contemplating smoking cessation.  Started on Lexapro 10 mg daily and advised to start this for several weeks for before starting Chantix.  She is here to follow this up today.  She feels it is helping and she plans on starting chantix soon.   Left great toe pain/swelling- on and off for months.  Not sure if it is ever red or warm.  Sometimes wakes her up at night. Toe nail was removed years ago due to recurrent ingrown nail.    Current Outpatient Prescriptions on File Prior to Visit  Medication Sig Dispense Refill  . aspirin-acetaminophen-caffeine (EXCEDRIN MIGRAINE) 250-250-65 MG per tablet Take 2-3 tablets by mouth every 6 (six) hours as needed for headache.    . cetirizine (ZYRTEC) 10 MG tablet Take 10 mg by mouth daily.    Marland Kitchen escitalopram (LEXAPRO) 10 MG tablet Take 1 tablet (10 mg total) by mouth daily. 30 tablet 2  . fluticasone (FLONASE) 50 MCG/ACT nasal spray Place 2 sprays into both nostrils daily.    Marland Kitchen omeprazole (PRILOSEC) 20 MG capsule Take 1 capsule (20 mg total) by mouth 2 (two) times daily. 60 capsule 3  . OVER THE COUNTER MEDICATION Take 1 application by mouth daily as needed (for psoriasis).    . varenicline (CHANTIX STARTING MONTH PAK) 0.5 MG X 11 & 1 MG X 42 tablet Take one 0.5 mg tablet by mouth once daily for 3 days, then increase to one 0.5 mg tablet twice daily for 4 days, then increase to one 1 mg tablet twice daily. 53 tablet 0  . albuterol (PROAIR HFA) 108 (90 BASE) MCG/ACT inhaler Inhale 2 puffs into the lungs every 6 (six) hours as needed for wheezing. 1 Inhaler 1   No current facility-administered  medications on file prior to visit.    No Known Allergies  Past Medical History  Diagnosis Date  . BV (bacterial vaginosis)     recurrent  . Absence of menstruation   . Acute bronchitis   . Cellulitis and abscess of unspecified site   . Dysphagia, unspecified(787.20)   . Genital herpes, unspecified   . Other acute sinusitis   . Other psoriasis   . Anal fissure   . Screening for lipoid disorders   . Tobacco use disorder     No past surgical history on file.  Family History  Problem Relation Age of Onset  . Hypertension Father   . Diabetes Father   . Cancer Father     blood  . Hypertension Mother   . Colon cancer  53    grandfather    Social History   Social History  . Marital Status: Married    Spouse Name: N/A  . Number of Children: N/A  . Years of Education: N/A   Occupational History  . in [employer] for [employer]    Social History Main Topics  . Smoking status: Current Every Day Smoker -- 0.50 packs/day    Types: Cigarettes  . Smokeless tobacco: Never Used     Comment: has the chantix but hasn't committed to stopping smoking yet  .  Alcohol Use: 0.0 oz/week    0 Standard drinks or equivalent per week     Comment: Occasional  . Drug Use: No  . Sexual Activity: Not on file   Other Topics Concern  . Not on file   Social History Narrative   Relocated from [place] 3 years ago. Married in 10/10. Lives in [place].   The PMH, PSH, Social History, Family History, Medications, and allergies have been reviewed in [facility], and have been updated if relevant.   Review of Systems  Constitutional: Negative.   HENT: Positive for congestion. Negative for dental problem and drooling.   Respiratory: Positive for cough. Negative for shortness of breath.   Cardiovascular: Negative.   Gastrointestinal: Negative.   Endocrine: Negative.   Genitourinary: Negative.   Musculoskeletal: Positive for joint swelling.  Skin: Negative.   Allergic/Immunologic: Negative.     Neurological: Negative.   Hematological: Negative.   Psychiatric/Behavioral: Negative.   All other systems reviewed and are negative.      Objective:    BP 112/70 mmHg  Pulse 67  Temp(Src) 97.9 F (36.6 C) (Oral)  Wt 186 lb (84.369 kg)  SpO2 94%  LMP 01/11/2016   Physical Exam  Constitutional: She is oriented to person, place, and time. She appears well-developed and well-nourished. No distress.  HENT:  Head: Normocephalic.  Eyes: Conjunctivae are normal.  Cardiovascular: Normal rate.   Musculoskeletal: Normal range of motion.       Feet:  Neurological: She is alert and oriented to person, place, and time. No cranial nerve deficit.  Skin: Skin is warm and dry. She is not diaphoretic.  Psychiatric: She has a normal mood and affect. Her behavior is normal. Judgment normal.  Nursing note and vitals reviewed.         Assessment & Plan:   Depression  TOBACCO ABUSE  Need for influenza vaccination - Plan: Flu Vaccine QUAD 36+ mos PF IM (Fluarix & Fluzone Quad PF)  Pain of left great toe - Plan: Uric Acid No Follow-up on file.

## 2016-01-22 NOTE — Assessment & Plan Note (Signed)
Intermittent- ? Gout. Check uric acid level today. The patient indicates understanding of these issues and agrees with the plan.

## 2016-01-22 NOTE — Assessment & Plan Note (Signed)
Smoking cessation instruction/counseling given:  counseled patient on the dangers of tobacco use, advised patient to stop smoking, and reviewed strategies to maximize success 

## 2016-01-22 NOTE — Patient Instructions (Signed)
Great to see you. Please keep me updated with your smoking- you can do this!

## 2016-01-22 NOTE — Progress Notes (Signed)
Pre visit review using our clinic review tool, if applicable. No additional management support is needed unless otherwise documented below in the visit note. 

## 2016-01-23 ENCOUNTER — Telehealth: Payer: Self-pay | Admitting: Family Medicine

## 2016-01-23 NOTE — Telephone Encounter (Signed)
Pt returned your call - ok to leave detailed message if no answer.  [phone] Thanks

## 2016-01-23 NOTE — Telephone Encounter (Signed)
Lm onpts vm and advised of results and instruction. See results note

## 2016-04-01 ENCOUNTER — Other Ambulatory Visit: Payer: Self-pay | Admitting: Internal Medicine

## 2016-06-01 IMAGING — CT CT ABD-PELV W/ CM
2 of 4 series · 4 of 46 positions shown, 5 images · IV contrast (omnipaque)
Comparison: None.

CLINICAL DATA: Right side abdominal pain for 5 days. Elevated white
blood cell count. Initial encounter.

EXAM:
CT ABDOMEN AND PELVIS WITH CONTRAST
TECHNIQUE: Multidetector CT imaging of the abdomen and pelvis was performed
using the standard protocol following bolus administration of
intravenous contrast.
CONTRAST:  100 mL OMNIPAQUE IOHEXOL 300 MG/ML  SOLN

[Series 204: cor · coronal · 0.50mm/px · 3 of 130 slices shown, 4 images]
[im 29/130  soft-tissue]
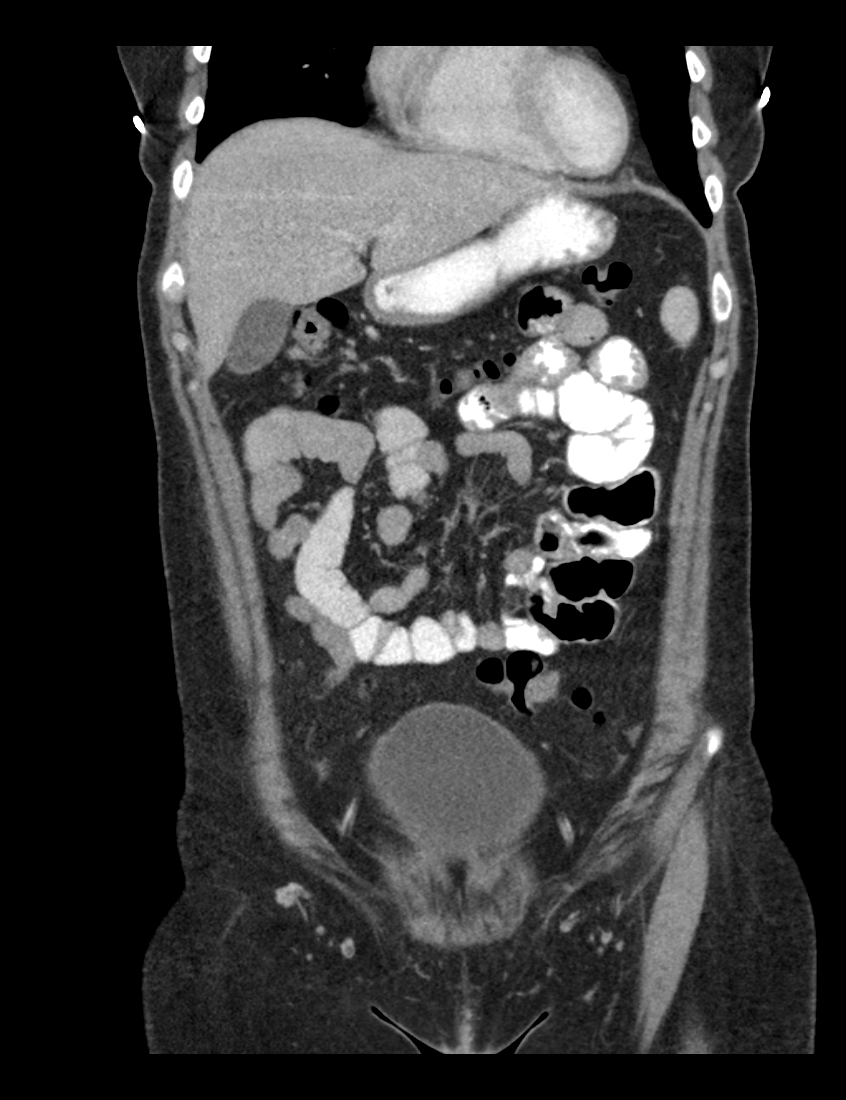
[im 29/130  bone]
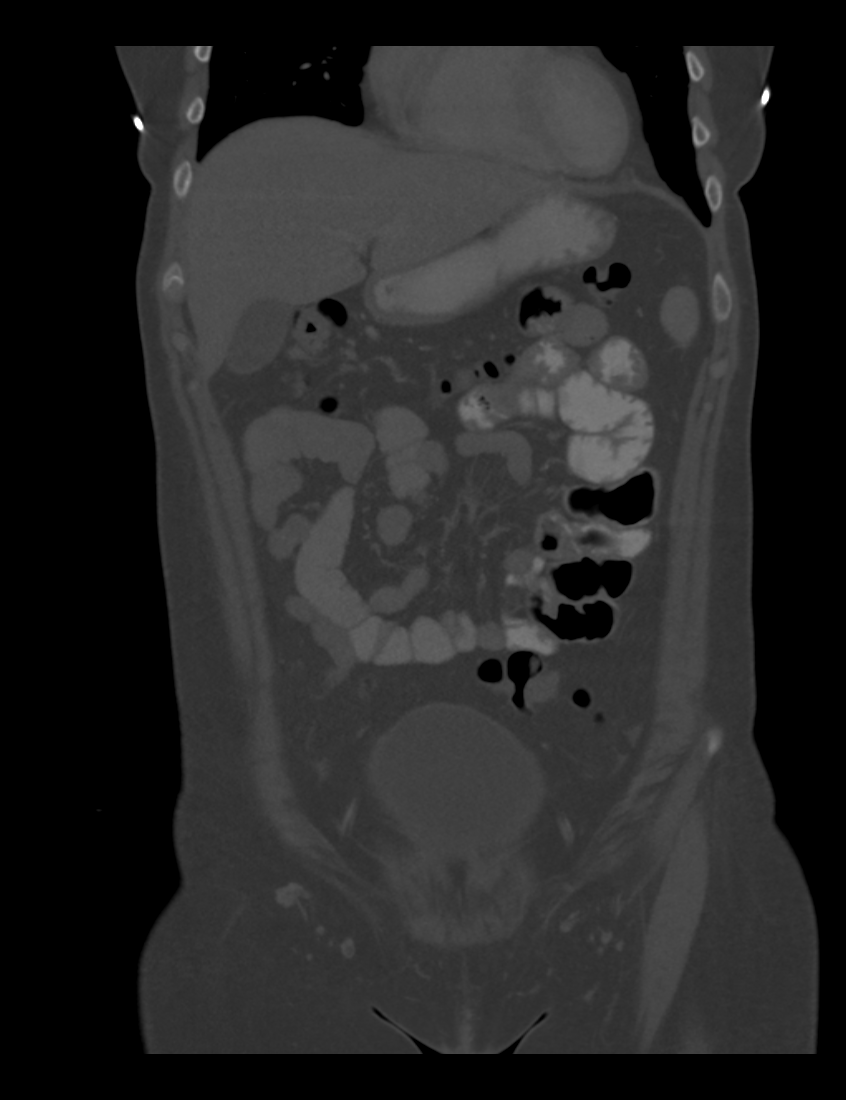
[im 72/130  soft-tissue]
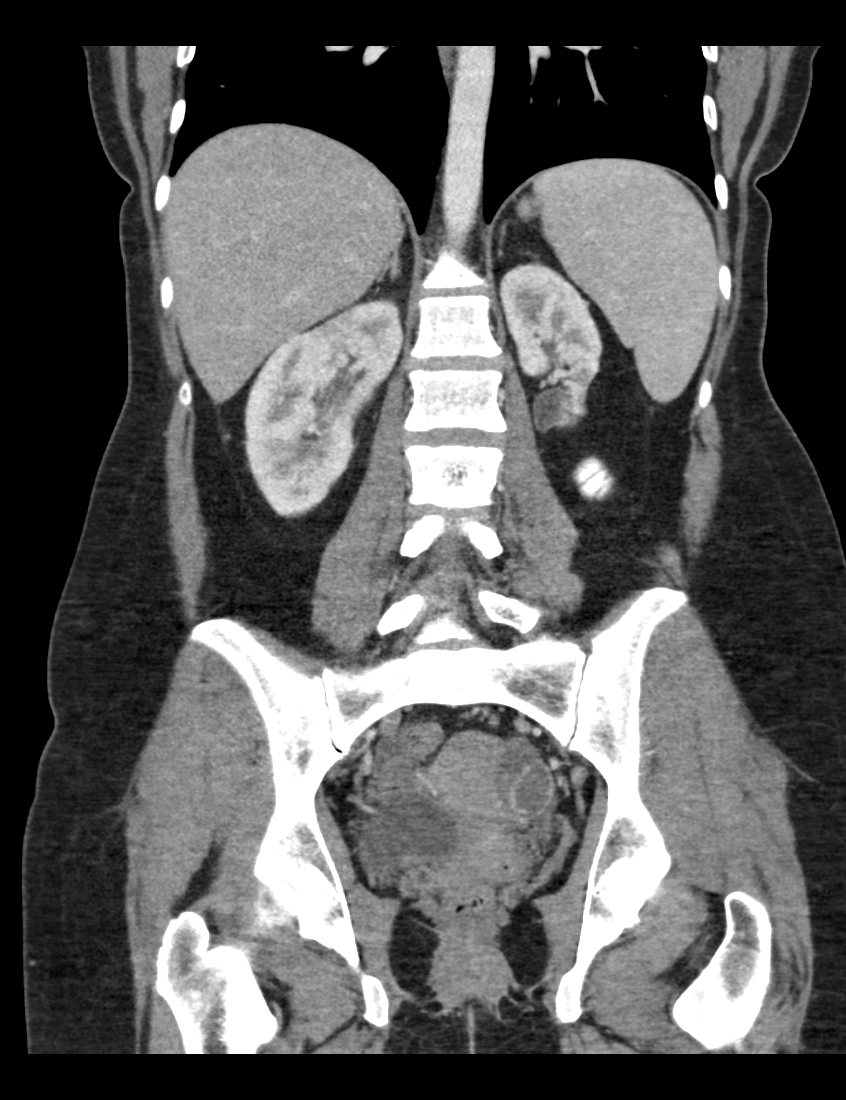
[im 101/130  soft-tissue]
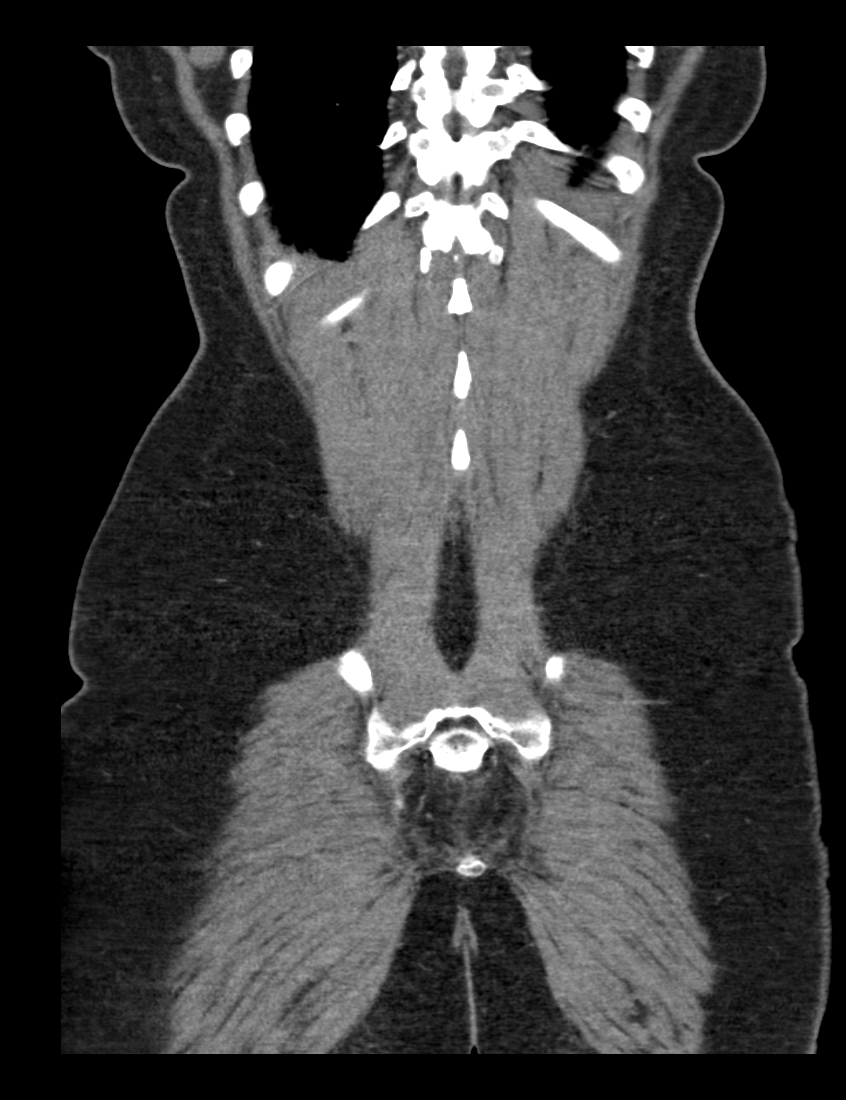

[Series 205: sag · sagittal · 0.50mm/px · 1 of 162 slices shown]
[im 54/162  soft-tissue]
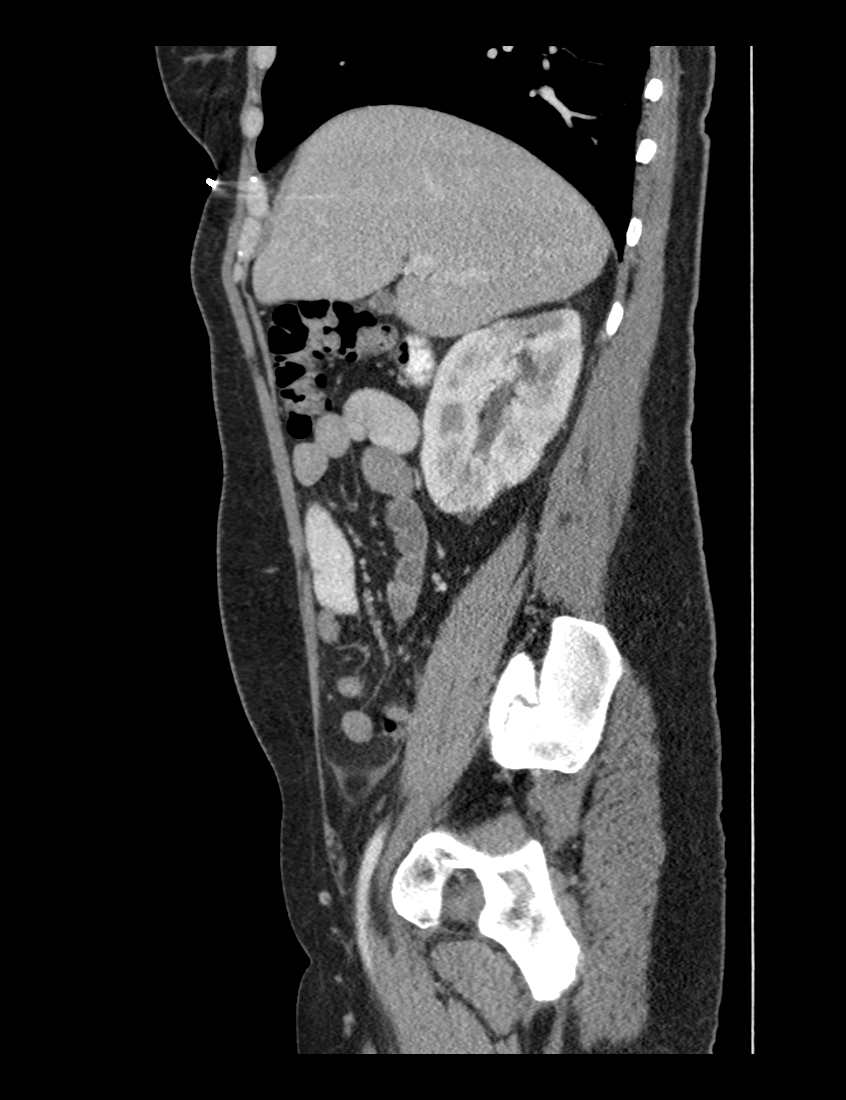

[4 of 46 positions shown; findings below may reference images not displayed]

FINDINGS: Mild dependent atelectasis is seen in the lung bases. No pleural or
pericardial effusion.

Stranding about the right ureter is identified. Subtle areas of
decreased cortical enhancement are identified in the mid and lower
pole of the right kidney worrisome for pyelonephritis. The patient
has a bifid left renal collecting system and duplicated ureters.
There is extensive scarring in the lower pole of the left kidney
likely due to reflux. Scar is also identified in the upper pole of
the right kidney and likely due to vesicoureteral reflux. The
patient appears to have a single right renal collecting system. The
urinary bladder is distended but otherwise unremarkable.

The gallbladder, spleen, adrenal glands and pancreas are
unremarkable. A low attenuating lesion in the left ovary measures
3.6 cm in diameter has an irregular border suggestive of an
involuting cyst. There is a small volume of free pelvic fluid. The
appendix is visualized and appears normal with gas identified in the
appendix. The stomach and small and large bowel are unremarkable. No
focal bony abnormality is identified.
IMPRESSION: Findings highly suggestive of chronic bilateral vesicoureteral
reflux. Duplicated left renal collecting system and ureters are
identified.

Subtle decreased cortical enhancement in the midpole of the right
kidney is worrisome for pyelonephritis. Stranding about the right
ureter also suggests infectious or inflammatory process.

Findings compatible with an involuting left ovarian cyst.

Negative for appendicitis.

## 2016-06-03 ENCOUNTER — Ambulatory Visit (INDEPENDENT_AMBULATORY_CARE_PROVIDER_SITE_OTHER): Payer: Managed Care, Other (non HMO) | Admitting: Family Medicine

## 2016-06-03 ENCOUNTER — Encounter: Payer: Self-pay | Admitting: Family Medicine

## 2016-06-03 VITALS — BP 124/72 | HR 80 | Temp 98.1°F | Wt 199.0 lb

## 2016-06-03 DIAGNOSIS — J3089 Other allergic rhinitis: Secondary | ICD-10-CM | POA: Diagnosis not present

## 2016-06-03 MED ORDER — MONTELUKAST SODIUM 10 MG PO TABS: 10.0000 mg | ORAL_TABLET | Freq: Every day | ORAL | Status: DC

## 2016-06-03 NOTE — Progress Notes (Signed)
Pre visit review using our clinic review tool, if applicable. No additional management support is needed unless otherwise documented below in the visit note. 

## 2016-06-03 NOTE — Progress Notes (Signed)
SUBJECTIVE:  Beverly DodrillJennifer Moore is a 35 y.o. female who complains of coryza, sneezing, productive cough and bilateral sinus pain for 30 days. She denies a history of anorexia and chest pain and admits to a history of asthma. Patient admits to smoke cigarettes.   Zyrtec is not helping as used to.  Does think she has triggers at home.  Current Outpatient Prescriptions on File Prior to Visit  Medication Sig Dispense Refill  . aspirin-acetaminophen-caffeine (EXCEDRIN MIGRAINE) 250-250-65 MG per tablet Take 2-3 tablets by mouth every 6 (six) hours as needed for headache.    . cetirizine (ZYRTEC) 10 MG tablet Take 10 mg by mouth daily.    Marland Kitchen. escitalopram (LEXAPRO) 10 MG tablet TAKE 1 TABLET (10 MG TOTAL) BY MOUTH DAILY. 30 tablet 3  . fluticasone (FLONASE) 50 MCG/ACT nasal spray Place 2 sprays into both nostrils daily.    Marland Kitchen. omeprazole (PRILOSEC) 20 MG capsule Take 1 capsule (20 mg total) by mouth 2 (two) times daily. 60 capsule 3  . OVER THE COUNTER MEDICATION Take 1 application by mouth daily as needed (for psoriasis).    . varenicline (CHANTIX STARTING MONTH PAK) 0.5 MG X 11 & 1 MG X 42 tablet Take one 0.5 mg tablet by mouth once daily for 3 days, then increase to one 0.5 mg tablet twice daily for 4 days, then increase to one 1 mg tablet twice daily. 53 tablet 0   No current facility-administered medications on file prior to visit.    No Known Allergies  Past Medical History  Diagnosis Date  . BV (bacterial vaginosis)     recurrent  . Absence of menstruation   . Acute bronchitis   . Cellulitis and abscess of unspecified site   . Dysphagia, unspecified(787.20)   . Genital herpes, unspecified   . Other acute sinusitis   . Other psoriasis   . Anal fissure   . Screening for lipoid disorders   . Tobacco use disorder     No past surgical history on file.  Family History  Problem Relation Age of Onset  . Hypertension Father   . Diabetes Father   . Cancer Father     blood  . Hypertension  Mother   . Colon cancer  5750    grandfather    Social History   Social History  . Marital Status: Married    Spouse Name: N/A  . Number of Children: N/A  . Years of Education: N/A   Occupational History  . in [employer] for [employer]    Social History Main Topics  . Smoking status: Current Every Day Smoker -- 0.50 packs/day    Types: Cigarettes  . Smokeless tobacco: Never Used     Comment: has the chantix but hasn't committed to stopping smoking yet  . Alcohol Use: 0.0 oz/week    0 Standard drinks or equivalent per week     Comment: Occasional  . Drug Use: No  . Sexual Activity: Not on file   Other Topics Concern  . Not on file   Social History Narrative   [place]ocated from [place] ago. Married in 10/10. [place] in [place].   The PMH, PSH, Social History, Family History, Medications, and allergies have been reviewed in [facility], and have been updated if relevant.  OBJECTIVE: BP 124/72 mmHg  Pulse 80  Temp(Src) 98.1 F (36.7 C) (Oral)  Wt 199 lb (90.266 kg)  SpO2 97%  LMP 05/30/2016  She appears well, vital signs are as noted.  Ears normal.  Throat and pharynx normal.  Neck supple. No adenopathy in the neck. Nose is congested. Sinuses non tender. The chest is clear, with scattered wheezes or rales.  ASSESSMENT:  allergic rhinitis and bronchospasm  PLAN: Symptomatic therapy suggested: push fluids, rest and return office visit prn if symptoms persist or worsen. Lack of antibiotic effectiveness discussed with her. Call or return to clinic prn if these symptoms worsen or fail to improve as anticipated.

## 2016-06-03 NOTE — Patient Instructions (Signed)
Great to see you. We are starting singulair 10 mg daily.  Please update me in a couple of weeks.

## 2016-06-30 ENCOUNTER — Ambulatory Visit (INDEPENDENT_AMBULATORY_CARE_PROVIDER_SITE_OTHER): Payer: Managed Care, Other (non HMO) | Admitting: Family Medicine

## 2016-06-30 ENCOUNTER — Encounter: Payer: Self-pay | Admitting: Family Medicine

## 2016-06-30 VITALS — BP 132/72 | HR 79 | Temp 98.2°F | Wt 198.5 lb

## 2016-06-30 DIAGNOSIS — J208 Acute bronchitis due to other specified organisms: Secondary | ICD-10-CM | POA: Diagnosis not present

## 2016-06-30 MED ORDER — AZITHROMYCIN 250 MG PO TABS: ORAL_TABLET | ORAL | Status: DC

## 2016-06-30 MED ORDER — ALBUTEROL SULFATE HFA 108 (90 BASE) MCG/ACT IN AERS: 2.0000 | INHALATION_SPRAY | Freq: Four times a day (QID) | RESPIRATORY_TRACT | Status: DC | PRN

## 2016-06-30 NOTE — Progress Notes (Signed)
Pre visit review using our clinic review tool, if applicable. No additional management support is needed unless otherwise documented below in the visit note. 

## 2016-06-30 NOTE — Progress Notes (Signed)
SUBJECTIVE:  Beverly Moore is a 35 y.o. female who complains of coryza, congestion, sneezing and dry cough for 7 days. She denies a history of anorexia and chest pain and admits to a history of asthma. Patient admits to smoke cigarettes.  Singulair was helping with asthma prior to this.   Current Outpatient Prescriptions on File Prior to Visit  Medication Sig Dispense Refill  . aspirin-acetaminophen-caffeine (EXCEDRIN MIGRAINE) 250-250-65 MG per tablet Take 2-3 tablets by mouth every 6 (six) hours as needed for headache.    . cetirizine (ZYRTEC) 10 MG tablet Take 10 mg by mouth daily.    Marland Kitchen escitalopram (LEXAPRO) 10 MG tablet TAKE 1 TABLET (10 MG TOTAL) BY MOUTH DAILY. 30 tablet 3  . fluticasone (FLONASE) 50 MCG/ACT nasal spray Place 2 sprays into both nostrils daily.    . montelukast (SINGULAIR) 10 MG tablet Take 1 tablet (10 mg total) by mouth at bedtime. 30 tablet 3  . omeprazole (PRILOSEC) 20 MG capsule Take 1 capsule (20 mg total) by mouth 2 (two) times daily. 60 capsule 3  . OVER THE COUNTER MEDICATION Take 1 application by mouth daily as needed (for psoriasis).    . varenicline (CHANTIX STARTING MONTH PAK) 0.5 MG X 11 & 1 MG X 42 tablet Take one 0.5 mg tablet by mouth once daily for 3 days, then increase to one 0.5 mg tablet twice daily for 4 days, then increase to one 1 mg tablet twice daily. 53 tablet 0   No current facility-administered medications on file prior to visit.    No Known Allergies  Past Medical History  Diagnosis Date  . BV (bacterial vaginosis)     recurrent  . Absence of menstruation   . Acute bronchitis   . Cellulitis and abscess of unspecified site   . Dysphagia, unspecified(787.20)   . Genital herpes, unspecified   . Other acute sinusitis   . Other psoriasis   . Anal fissure   . Screening for lipoid disorders   . Tobacco use disorder     No past surgical history on file.  Family History  Problem Relation Age of Onset  . Hypertension Father   .  Diabetes Father   . Cancer Father     blood  . Hypertension Mother   . Colon cancer  42    grandfather    Social History   Social History  . Marital Status: Married    Spouse Name: N/A  . Number of Children: N/A  . Years of Education: N/A   Occupational History  . in [employer] for [employer]    Social History Main Topics  . Smoking status: Current Every Day Smoker -- 0.50 packs/day    Types: Cigarettes  . Smokeless tobacco: Never Used     Comment: has the chantix but hasn't committed to stopping smoking yet  . Alcohol Use: 0.0 oz/week    0 Standard drinks or equivalent per week     Comment: Occasional  . Drug Use: No  . Sexual Activity: Not on file   Other Topics Concern  . Not on file   Social History Narrative   Relocated from [place] 3 years ago. Married in 10/10. Lives in [place].   The PMH, PSH, Social History, Family History, Medications, and allergies have been reviewed in [facility], and have been updated if relevant.  OBJECTIVE: BP 132/72 mmHg  Pulse 79  Temp(Src) 98.2 F (36.8 C) (Oral)  Wt 198 lb 8 oz (90.039 kg)  SpO2  96%  LMP 06/19/2016  She appears well, vital signs are as noted. Ears normal.  Throat and pharynx normal.  Neck supple. No adenopathy in the neck. Nose is congested. Sinuses  tender. Wheezes throughout  ASSESSMENT:  sinusitis and bronchitis  PLAN: zpack Proair as needed for wheezing/cough Smoking cessation again advised Symptomatic therapy suggested: push fluids, rest and return office visit prn if symptoms persist or worsen. Call or return to clinic prn if these symptoms worsen or fail to improve as anticipated.

## 2016-09-17 ENCOUNTER — Other Ambulatory Visit: Payer: Self-pay

## 2016-09-17 MED ORDER — ESCITALOPRAM OXALATE 10 MG PO TABS: ORAL_TABLET | ORAL | 0 refills | Status: DC

## 2016-09-17 NOTE — Addendum Note (Signed)
Addended by: Desmond DikeKNIGHT, Eulon Allnutt H on: 09/17/2016 05:03 PM   Modules accepted: Orders

## 2016-09-17 NOTE — Telephone Encounter (Signed)
Last refill 04/01/16 #30 +3, last OV 7/12. Ok to refill?

## 2016-09-28 ENCOUNTER — Other Ambulatory Visit: Payer: Self-pay | Admitting: *Deleted

## 2016-09-28 MED ORDER — ESCITALOPRAM OXALATE 10 MG PO TABS: ORAL_TABLET | ORAL | 0 refills | Status: DC

## 2016-09-28 NOTE — Addendum Note (Signed)
Addended by: Desmond DikeKNIGHT, Kia Stavros H on: 09/28/2016 08:29 AM   Modules accepted: Orders

## 2016-09-28 NOTE — Telephone Encounter (Signed)
Rx would not go electronically, awaiting sig to be faxed

## 2016-10-27 ENCOUNTER — Ambulatory Visit (INDEPENDENT_AMBULATORY_CARE_PROVIDER_SITE_OTHER): Payer: Managed Care, Other (non HMO) | Admitting: Family Medicine

## 2016-10-27 ENCOUNTER — Encounter: Payer: Self-pay | Admitting: Family Medicine

## 2016-10-27 VITALS — BP 120/58 | HR 63 | Temp 98.1°F | Ht 68.0 in | Wt 204.2 lb

## 2016-10-27 DIAGNOSIS — J301 Allergic rhinitis due to pollen: Secondary | ICD-10-CM

## 2016-10-27 DIAGNOSIS — Z0001 Encounter for general adult medical examination with abnormal findings: Secondary | ICD-10-CM | POA: Diagnosis not present

## 2016-10-27 DIAGNOSIS — Z01419 Encounter for gynecological examination (general) (routine) without abnormal findings: Secondary | ICD-10-CM

## 2016-10-27 LAB — LIPID PANEL
CHOLESTEROL: 158 mg/dL (ref 0–200)
HDL: 78 mg/dL (ref >39.00)
LDL Cholesterol: 66 mg/dL (ref 0–99)
NonHDL: 79.67
TRIGLYCERIDES: 67 mg/dL (ref 0.0–149.0)
Total CHOL/HDL Ratio: 2
VLDL: 13.4 mg/dL (ref 0.0–40.0)

## 2016-10-27 LAB — CBC WITH DIFFERENTIAL/PLATELET
Basophils Absolute: 0 10*3/uL (ref 0.0–0.1)
Basophils Relative: 0.4 % (ref 0.0–3.0)
EOS ABS: 0.2 10*3/uL (ref 0.0–0.7)
Eosinophils Relative: 3.3 % (ref 0.0–5.0)
HCT: 43 % (ref 36.0–46.0)
HEMOGLOBIN: 14.8 g/dL (ref 12.0–15.0)
LYMPHS PCT: 32.7 % (ref 12.0–46.0)
Lymphs Abs: 2.3 10*3/uL (ref 0.7–4.0)
MCHC: 34.4 g/dL (ref 30.0–36.0)
MCV: 94.1 fl (ref 78.0–100.0)
MONO ABS: 0.7 10*3/uL (ref 0.1–1.0)
Monocytes Relative: 9.2 % (ref 3.0–12.0)
Neutro Abs: 3.9 10*3/uL (ref 1.4–7.7)
Neutrophils Relative %: 54.4 % (ref 43.0–77.0)
Platelets: 195 10*3/uL (ref 150.0–400.0)
RBC: 4.57 Mil/uL (ref 3.87–5.11)
RDW: 13.3 % (ref 11.5–15.5)
WBC: 7.1 10*3/uL (ref 4.0–10.5)

## 2016-10-27 LAB — COMPREHENSIVE METABOLIC PANEL
ALBUMIN: 4.5 g/dL (ref 3.5–5.2)
ALK PHOS: 38 U/L — AB (ref 39–117)
ALT: 14 U/L (ref 0–35)
AST: 18 U/L (ref 0–37)
BILIRUBIN TOTAL: 0.4 mg/dL (ref 0.2–1.2)
BUN: 11 mg/dL (ref 6–23)
CO2: 27 mEq/L (ref 19–32)
CREATININE: 0.86 mg/dL (ref 0.40–1.20)
Calcium: 9.6 mg/dL (ref 8.4–10.5)
Chloride: 106 mEq/L (ref 96–112)
GFR: 79.47 mL/min (ref >60.00)
Glucose, Bld: 86 mg/dL (ref 70–99)
Potassium: 4.2 mEq/L (ref 3.5–5.1)
SODIUM: 138 meq/L (ref 135–145)
Total Protein: 7.4 g/dL (ref 6.0–8.3)

## 2016-10-27 LAB — TSH: TSH: 1.06 u[IU]/mL (ref 0.35–4.50)

## 2016-10-27 MED ORDER — ESCITALOPRAM OXALATE 10 MG PO TABS: ORAL_TABLET | ORAL | 2 refills | Status: DC

## 2016-10-27 NOTE — Assessment & Plan Note (Signed)
Deteriorated. Restart flonase. Continue anthistamines. Call or return to clinic prn if these symptoms worsen or fail to improve as anticipated. The patient indicates understanding of these issues and agrees with the plan.

## 2016-10-27 NOTE — Assessment & Plan Note (Signed)
Reviewed preventive care protocols, scheduled due services, and updated immunizations Discussed nutrition, exercise, diet, and healthy lifestyle.  Orders Placed This Encounter  Procedures  . CBC with Differential/Platelet  . Comprehensive metabolic panel  . Lipid panel  . TSH     

## 2016-10-27 NOTE — Progress Notes (Signed)
Pre visit review using our clinic review tool, if applicable. No additional management support is needed unless otherwise documented below in the visit note. 

## 2016-10-27 NOTE — Patient Instructions (Signed)
Great to see you. We will call you with your results and when your form is ready.  Restart flonase, continue zyrtec and singulair.

## 2016-10-27 NOTE — Progress Notes (Signed)
Subjective:   Patient ID: Beverly DodrillJennifer Moore, female    DOB: 1981/05/03, 35 y.o.   MRN: [id number]  Beverly Moore is a pleasant 35 y.o. year old female who presents to clinic today with Annual Exam and Nasal Congestion  on 10/27/2016  HPI:  Has OBGYN- has pap smear appointment scheduled for next Friday.  Back on Humira for her psorasis and it has improved.  Depression has been well controlled with lexapro.  Did not yet start Chantix.  Hasn't picked a quit date.   Current Outpatient Prescriptions on File Prior to Visit  Medication Sig Dispense Refill  . albuterol (PROVENTIL HFA;VENTOLIN HFA) 108 (90 Base) MCG/ACT inhaler Inhale 2 puffs into the lungs every 6 (six) hours as needed. 1 Inhaler 0  . aspirin-acetaminophen-caffeine (EXCEDRIN MIGRAINE) 250-250-65 MG per tablet Take 2-3 tablets by mouth every 6 (six) hours as needed for headache.    . cetirizine (ZYRTEC) 10 MG tablet Take 10 mg by mouth daily.    . fluticasone (FLONASE) 50 MCG/ACT nasal spray Place 2 sprays into both nostrils daily.    . montelukast (SINGULAIR) 10 MG tablet Take 1 tablet (10 mg total) by mouth at bedtime. 30 tablet 3  . omeprazole (PRILOSEC) 20 MG capsule Take 1 capsule (20 mg total) by mouth 2 (two) times daily. 60 capsule 3  . OVER THE COUNTER MEDICATION Take 1 application by mouth daily as needed (for psoriasis).     No current facility-administered medications on file prior to visit.     No Known Allergies  Past Medical History:  Diagnosis Date  . Absence of menstruation   . Acute bronchitis   . Anal fissure   . BV (bacterial vaginosis)    recurrent  . Cellulitis and abscess of unspecified site   . Dysphagia, unspecified(787.20)   . Genital herpes, unspecified   . Other acute sinusitis   . Other psoriasis   . Screening for lipoid disorders   . Tobacco use disorder     No past surgical history on file.  Family History  Problem Relation Age of Onset  . Hypertension Father   . Diabetes  Father   . Cancer Father     blood  . Hypertension Mother   . Colon cancer  850    grandfather    Social History   Social History  . Marital status: Married    Spouse name: N/A  . Number of children: N/A  . Years of education: N/A   Occupational History  . in [employer] for [employer]    Social History Main Topics  . Smoking status: Current Every Day Smoker    Packs/day: 0.50    Types: Cigarettes  . Smokeless tobacco: Never Used     Comment: has the chantix but hasn't committed to stopping smoking yet  . Alcohol use 0.0 oz/week     Comment: Occasional  . Drug use: No  . Sexual activity: Not on file   Other Topics Concern  . Not on file   Social History Narrative   [place] ago. Married in 10/10. [place] in [place].   The PMH, PSH, Social History, Family History, Medications, and allergies have been reviewed in [facility], and have been updated if relevant.   Review of Systems  Constitutional: Negative.   HENT: Positive for congestion and postnasal drip. Negative for dental problem, drooling, facial swelling, hearing loss, mouth sores, rhinorrhea, sinus pain, sinus pressure and sneezing.   Eyes: Negative.  Respiratory: Negative.   Cardiovascular: Negative.   Gastrointestinal: Negative.   Endocrine: Negative.   Genitourinary: Negative.   Musculoskeletal: Negative.   Skin: Negative.   Allergic/Immunologic: Negative.   Neurological: Negative.   Hematological: Negative.   Psychiatric/Behavioral: Negative.   All other systems reviewed and are negative.      Objective:    BP (!) 120/58   Pulse 63   Temp 98.1 F (36.7 C) (Oral)   Ht 5\' 8"  (1.727 m)   Wt 204 lb 4 oz (92.6 kg)   LMP 10/01/2016   SpO2 94%   BMI 31.06 kg/m    Physical Exam   General:  Well-developed,well-nourished,in no acute distress; alert,appropriate and cooperative throughout examination Head:  normocephalic and atraumatic.   Eyes:  vision grossly intact, PERRL Ears:   R ear normal and L ear normal externally, TMs clear bilaterally Nose:  no external deformity.   Nasal mucosa red, appears inflamed but non tender  Mouth:  good dentition.   Neck:  No deformities, masses, or tenderness noted. Lungs:  Normal respiratory effort, chest expands symmetrically. Lungs are clear to auscultation, no crackles or wheezes. Heart:  Normal rate and regular rhythm. S1 and S2 normal without gallop, murmur, click, rub or other extra sounds. Abdomen:  Bowel sounds positive,abdomen soft and non-tender without masses, organomegaly or hernias noted. Msk:  No deformity or scoliosis noted of thoracic or lumbar spine.   Extremities:  No clubbing, cyanosis, edema, or deformity noted with normal full range of motion of all joints.   Neurologic:  alert & oriented X3 and gait normal.   Skin:  Intact without suspicious lesions or rashes Cervical Nodes:  No lymphadenopathy noted Axillary Nodes:  No palpable lymphadenopathy Psych:  Cognition and judgment appear intact. Alert and cooperative with normal attention span and concentration. No apparent delusions, illusions, hallucinations       Assessment & Plan:   Well woman exam - Plan: CBC with Differential/Platelet, Comprehensive metabolic panel, Lipid panel, TSH No Follow-up on file.

## 2017-03-29 ENCOUNTER — Other Ambulatory Visit: Payer: Self-pay | Admitting: Family Medicine

## 2017-04-20 ENCOUNTER — Ambulatory Visit (INDEPENDENT_AMBULATORY_CARE_PROVIDER_SITE_OTHER): Payer: Managed Care, Other (non HMO) | Admitting: Internal Medicine

## 2017-04-20 ENCOUNTER — Encounter: Payer: Self-pay | Admitting: Internal Medicine

## 2017-04-20 VITALS — BP 122/70 | HR 65 | Temp 98.4°F | Wt 217.0 lb

## 2017-04-20 DIAGNOSIS — J0100 Acute maxillary sinusitis, unspecified: Secondary | ICD-10-CM

## 2017-04-20 MED ORDER — PREDNISONE 10 MG PO TABS: ORAL_TABLET | ORAL | 0 refills | Status: DC

## 2017-04-20 NOTE — Progress Notes (Signed)
HPI  Pt presents to the clinic today with c/o facial pressure, nasal congestion, cough and shortness of breath. She reports this started 1-2 months ago. She is blowing thick, clear mucous out of her nose. The cough is non productive. She denies fever, chills or body aches. She has tried Flonase, Singulair, Zyrtec, Mucinex and Afrin with minimal relief. She has a history of seasonal allergies but has not had sick contacts. She does smoke.   Review of Systems     Past Medical History:  Diagnosis Date  . Absence of menstruation   . Acute bronchitis   . Anal fissure   . BV (bacterial vaginosis)    recurrent  . Cellulitis and abscess of unspecified site   . Dysphagia, unspecified(787.20)   . Genital herpes, unspecified   . Other acute sinusitis   . Other psoriasis   . Screening for lipoid disorders   . Tobacco use disorder     Family History  Problem Relation Age of Onset  . Hypertension Father   . Diabetes Father   . Cancer Father     blood  . Hypertension Mother   . Colon cancer  60    grandfather    Social History   Social History  . Marital status: Married    Spouse name: N/A  . Number of children: N/A  . Years of education: N/A   Occupational History  . in [employer] for [employer]    Social History Main Topics  . Smoking status: Current Every Day Smoker    Packs/day: 0.50    Types: Cigarettes  . Smokeless tobacco: Never Used     Comment: has the chantix but hasn't committed to stopping smoking yet  . Alcohol use 0.0 oz/week     Comment: Occasional  . Drug use: No  . Sexual activity: Not on file   Other Topics Concern  . Not on file   Social History Narrative   Relocated from [place] 3 years ago. Married in 10/10. Lives in [place].    No Known Allergies   Constitutional: Denies headache, fatigue, fever or abrupt weight changes.  HEENT:  Positive facial pressure, nasal congestionDenies eye redness, ear pain, ringing in the ears, wax buildup, runny nose  or sore throat. Respiratory: Positive cough. Denies difficulty breathing or shortness of breath.  Cardiovascular: Denies chest pain, chest tightness, palpitations or swelling in the hands or feet.   No other specific complaints in a complete review of systems (except as listed in HPI above).  Objective:  BP 122/70   Pulse 65   Temp 98.4 F (36.9 C) (Oral)   Wt 217 lb (98.4 kg)   LMP 04/19/2017   SpO2 97%   BMI 32.99 kg/m   General: Appears her stated age, in NAD. HEENT: Head: normal shape and size, no sinus tenderness noted; Ears: Tm's gray and intact, normal light reflex; Nose: mucosa boggy and moist, septum midline; Throat/Mouth: + PND. Teeth present, mucosa pink and moist, no exudate noted, no lesions or ulcerations noted.  Neck:  No adenopathy noted.  Cardiovascular: Normal rate and rhythm.  Pulmonary/Chest: Normal effort and positive vesicular breath sounds with intermittent expiratory wheeze noted. No respiratory distress. No rales or ronchi noted.       Assessment & Plan:   Sinusitis  Can use a Neti Pot which can be purchased from your local drug store. No indication for abx at this time Continue Singulair and Zyrtec Stop smoking eRx for Pred Taper x 6  days  RTC as needed or if symptoms persist. Webb Silversmith, NP

## 2017-04-20 NOTE — Patient Instructions (Signed)

## 2017-04-21 ENCOUNTER — Ambulatory Visit: Payer: Managed Care, Other (non HMO) | Admitting: Family Medicine

## 2017-06-03 ENCOUNTER — Other Ambulatory Visit: Payer: Self-pay | Admitting: Family Medicine

## 2017-10-14 ENCOUNTER — Other Ambulatory Visit: Payer: Self-pay | Admitting: Family Medicine

## 2017-10-24 ENCOUNTER — Ambulatory Visit: Payer: Managed Care, Other (non HMO) | Admitting: Family Medicine

## 2017-10-24 ENCOUNTER — Encounter: Payer: Self-pay | Admitting: Family Medicine

## 2017-10-24 DIAGNOSIS — R5383 Other fatigue: Secondary | ICD-10-CM

## 2017-10-24 DIAGNOSIS — H539 Unspecified visual disturbance: Secondary | ICD-10-CM | POA: Diagnosis not present

## 2017-10-24 LAB — CBC WITH DIFFERENTIAL/PLATELET
BASOS ABS: 0 10*3/uL (ref 0.0–0.1)
BASOS PCT: 0.7 % (ref 0.0–3.0)
EOS PCT: 4.2 % (ref 0.0–5.0)
Eosinophils Absolute: 0.3 10*3/uL (ref 0.0–0.7)
HEMATOCRIT: 43.6 % (ref 36.0–46.0)
Hemoglobin: 15 g/dL (ref 12.0–15.0)
LYMPHS PCT: 30.7 % (ref 12.0–46.0)
Lymphs Abs: 2 10*3/uL (ref 0.7–4.0)
MCHC: 34.3 g/dL (ref 30.0–36.0)
MCV: 96.4 fl (ref 78.0–100.0)
MONOS PCT: 10.1 % (ref 3.0–12.0)
Monocytes Absolute: 0.7 10*3/uL (ref 0.1–1.0)
NEUTROS ABS: 3.5 10*3/uL (ref 1.4–7.7)
Neutrophils Relative %: 54.3 % (ref 43.0–77.0)
Platelets: 231 10*3/uL (ref 150.0–400.0)
RBC: 4.53 Mil/uL (ref 3.87–5.11)
RDW: 13.1 % (ref 11.5–15.5)
WBC: 6.5 10*3/uL (ref 4.0–10.5)

## 2017-10-24 LAB — COMPREHENSIVE METABOLIC PANEL
ALK PHOS: 57 U/L (ref 39–117)
ALT: 16 U/L (ref 0–35)
AST: 19 U/L (ref 0–37)
Albumin: 4.4 g/dL (ref 3.5–5.2)
BUN: 11 mg/dL (ref 6–23)
CALCIUM: 9.8 mg/dL (ref 8.4–10.5)
CHLORIDE: 105 meq/L (ref 96–112)
CO2: 26 mEq/L (ref 19–32)
Creatinine, Ser: 0.82 mg/dL (ref 0.40–1.20)
GFR: 83.5 mL/min (ref >60.00)
Glucose, Bld: 108 mg/dL — ABNORMAL HIGH (ref 70–99)
Potassium: 4.2 mEq/L (ref 3.5–5.1)
Sodium: 139 mEq/L (ref 135–145)
Total Bilirubin: 0.3 mg/dL (ref 0.2–1.2)
Total Protein: 7.6 g/dL (ref 6.0–8.3)

## 2017-10-24 LAB — HEMOGLOBIN A1C: Hgb A1c MFr Bld: 5.5 % (ref 4.6–6.5)

## 2017-10-24 LAB — TSH: TSH: 1.97 u[IU]/mL (ref 0.35–4.50)

## 2017-10-24 MED ORDER — ESCITALOPRAM OXALATE 10 MG PO TABS: ORAL_TABLET | ORAL | 1 refills | Status: DC

## 2017-10-24 MED ORDER — MONTELUKAST SODIUM 10 MG PO TABS: 10.0000 mg | ORAL_TABLET | Freq: Every day | ORAL | 11 refills | Status: DC

## 2017-10-24 MED ORDER — OMEPRAZOLE 20 MG PO CPDR: 20.0000 mg | DELAYED_RELEASE_CAPSULE | Freq: Two times a day (BID) | ORAL | 11 refills | Status: DC

## 2017-10-24 NOTE — Assessment & Plan Note (Signed)
With fatigue, increased thirst. Check labs today- rule out diabetes, thyroid dysfunction. Proceed with dilated eye exam with her eye doctor.

## 2017-10-24 NOTE — Progress Notes (Signed)
Subjective:   Patient ID: Beverly Moore, female    DOB: 12-27-1980, 36 y.o.   MRN: [id number]  Beverly Moore is a pleasant 36 y.o. year old female who presents to clinic today with Blurred Vision (Patient is here today C/O intermittent blurry vision.  States that it happens when she is driving sometime has the blurriness or when she changes positions.  She also states that she has numbness in 1st 2 toes on her right foot.  She has been experiencing fatigue and it doesn't matter if she slees 7-10 hours she feels like a Zombie.  Family H/O DM.  Last vision exam was a year ago and will be scheduling it for her job.  Is requesting Lexapro to be sent to [facility] and Singulair and Omeprazole to local.)  on 10/24/2017  HPI:  Several complaints today, primarily fatigue and blurred vision.  Blurred vision is intermittent, seems to happen mostly when she is driving or changing positions.  Very tired- does not seem to matter if she sleeps 10 hours at night.   She will be scheduling an eye exam soon- needs it for her employer.  Last eye exam was last year.  No results found for: HGBA1C  Has also noted some numbness in her 1st and second toes of her right foot.  +FH of DM  Current Outpatient Medications on File Prior to Visit  Medication Sig Dispense Refill  . albuterol (PROVENTIL HFA;VENTOLIN HFA) 108 (90 Base) MCG/ACT inhaler Inhale 2 puffs into the lungs every 6 (six) hours as needed. 1 Inhaler 0  . aspirin-acetaminophen-caffeine (EXCEDRIN MIGRAINE) 250-250-65 MG per tablet Take 2-3 tablets by mouth every 6 (six) hours as needed for headache.    . cetirizine (ZYRTEC) 10 MG tablet Take 10 mg by mouth daily.    . fluticasone (FLONASE) 50 MCG/ACT nasal spray Place 2 sprays into both nostrils daily.    Marland Kitchen HUMIRA PEN 40 MG/0.8ML PNKT      No current facility-administered medications on file prior to visit.     No Known Allergies  Past Medical History:  Diagnosis Date  . Absence of  menstruation   . Acute bronchitis   . Anal fissure   . BV (bacterial vaginosis)    recurrent  . Cellulitis and abscess of unspecified site   . Dysphagia, unspecified(787.20)   . Genital herpes, unspecified   . Other acute sinusitis   . Other psoriasis   . Screening for lipoid disorders   . Tobacco use disorder     No past surgical history on file.  Family History  Problem Relation Age of Onset  . Hypertension Father   . Diabetes Father   . Cancer Father        blood  . Hypertension Mother   . Colon cancer Unknown 28       grandfather    Social History   Socioeconomic History  . Marital status: Married    Spouse name: Not on file  . Number of children: Not on file  . Years of education: Not on file  . Highest education level: Not on file  Social Needs  . Financial resource strain: Not on file  . Food insecurity - worry: Not on file  . Food insecurity - inability: Not on file  . Transportation needs - medical: Not on file  . Transportation needs - non-medical: Not on file  Occupational History  . Occupation: in Armed forces technical officer for [employer]  Tobacco Use  . Smoking status:  Current Every Day Smoker    Packs/day: 0.50    Types: Cigarettes  . Smokeless tobacco: Never Used  . Tobacco comment: has the chantix but hasn't committed to stopping smoking yet  Substance and Sexual Activity  . Alcohol use: Yes    Alcohol/week: 0.0 oz    Comment: Occasional  . Drug use: No  . Sexual activity: Not on file  Other Topics Concern  . Not on file  Social History Narrative   [place] ago. Mar[place] in 10/10. Lives in [place].   The PMH, PSH, Social History, Family History, Medications, and allergies [facility], and have been updated if relevant.   Review of Systems  Constitutional: Positive for fatigue. Negative for fever and unexpected weight change.  Eyes: Positive for visual disturbance. Negative for photophobia, pain, discharge, redness and  itching.  Respiratory: Negative.   Cardiovascular: Negative.   Gastrointestinal: Negative.   Endocrine: Positive for polydipsia and polyuria.  Genitourinary: Negative.   Musculoskeletal: Negative.   Neurological: Positive for numbness. Negative for dizziness, facial asymmetry, light-headedness and headaches.  Hematological: Negative.   Psychiatric/Behavioral: Negative.   All other systems reviewed and are negative.      Objective:    BP 126/82 (BP Location: Right Arm, Patient Position: Sitting, Cuff Size: Normal)   Pulse 74   Temp 98.4 F (36.9 C) (Oral)   Ht 5\' 8"  (1.727 m)   Wt 219 lb 12.8 oz (99.7 kg)   SpO2 97%   BMI 33.42 kg/m   Wt Readings from Last 3 Encounters:  10/24/17 219 lb 12.8 oz (99.7 kg)  04/20/17 217 lb (98.4 kg)  10/27/16 204 lb 4 oz (92.6 kg)     Physical Exam    General:  Well-developed,well-nourished,in no acute distress; alert,appropriate and cooperative throughout examination Head:  normocephalic and atraumatic.   Eyes:  vision grossly intact, PERRL Ears:  R ear normal and L ear normal externally Nose:  no external deformity.   Mouth:  good dentition.   Neck:  No deformities, masses, or tenderness noted. Lungs:  Normal respiratory effort, chest expands symmetrically. Lungs are clear to auscultation, no crackles or wheezes. Heart:  Normal rate and regular rhythm. S1 and S2 normal without gallop, murmur, click, rub or other extra sounds. Msk:  No deformity or scoliosis noted of thoracic or lumbar spine.   Extremities:  No clubbing, cyanosis, edema, or deformity noted with normal full range of motion of all joints.   Neurologic:  alert & oriented X3 and gait normal.   Skin:  Intact without suspicious lesions or rashes Psych:  Cognition and judgment appear intact. Alert and cooperative with normal attention span and concentration. No apparent delusions, illusions, hallucinations      Assessment & Plan:   Other fatigue - Plan: Hemoglobin A1c,  CBC with Differential/Platelet, Comprehensive metabolic panel, TSH  Visual changes - Plan: Hemoglobin A1c, CBC with Differential/Platelet, Comprehensive metabolic panel, TSH No Follow-up on file.

## 2017-10-26 ENCOUNTER — Other Ambulatory Visit: Payer: Self-pay | Admitting: Family Medicine

## 2017-10-26 DIAGNOSIS — H539 Unspecified visual disturbance: Secondary | ICD-10-CM

## 2018-05-18 ENCOUNTER — Ambulatory Visit: Payer: Managed Care, Other (non HMO) | Admitting: Family Medicine

## 2018-05-18 ENCOUNTER — Other Ambulatory Visit (HOSPITAL_COMMUNITY)
Admission: RE | Admit: 2018-05-18 | Discharge: 2018-05-18 | Disposition: A | Discharge status: Home / Self Care | Payer: Managed Care, Other (non HMO) | Source: Ambulatory Visit | Attending: Family Medicine | Admitting: Family Medicine

## 2018-05-18 ENCOUNTER — Encounter: Payer: Self-pay | Admitting: Family Medicine

## 2018-05-18 VITALS — BP 130/82 | HR 59

## 2018-05-18 DIAGNOSIS — N898 Other specified noninflammatory disorders of vagina: Secondary | ICD-10-CM | POA: Insufficient documentation

## 2018-05-18 NOTE — Assessment & Plan Note (Signed)
Discharge with appearance of yeast however with history of recurrent BV will await return of aptima swab Testing for GC/Chlamydia/trich also done.

## 2018-05-18 NOTE — Progress Notes (Signed)
Beverly Moore - 37 y.o. female MRN [id number]  Date of birth: May 06, 1981  Subjective Chief Complaint  Patient presents with  . Vaginal Discharge    HPI Beverly Moore is a 37 y.o. female with a history of recurrent BV and genital HSV here today with vaginal discharge and discomfort.  Reports that symptoms began about 1 week ago after completion of her menstrual period.  Discharge is white and somewhat thick.  She has not noticed any odor with this.  She also believes there is a bump on the labia that is sore to touch.  She denies abdominal pain, pain with intercourse, dysuria, vaginal bleeding, fever or chills.   ROS:  ROS complete and negative except as noted per HPI No Known Allergies  Past Medical History:  Diagnosis Date  . Absence of menstruation   . Acute bronchitis   . Anal fissure   . BV (bacterial vaginosis)    recurrent  . Cellulitis and abscess of unspecified site   . Dysphagia, unspecified(787.20)   . Genital herpes, unspecified   . Other acute sinusitis   . Other psoriasis   . Screening for lipoid disorders   . Tobacco use disorder     No past surgical history on file.  Social History   Socioeconomic History  . Marital status: Married    Spouse name: Not on file  . Number of children: Not on file  . Years of education: Not on file  . Highest education level: Not on file  Occupational History  . 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 Financial resource strain: Not on file  . Food insecurity:    Worry: Not on file    Inability: Not on file  . Transportation needs:    Medical: Not on file    Non-medical: Not on file  Tobacco Use  . Smoking status: Current Every Day Smoker    Packs/day: 0.50    Types: Cigarettes  . Smokeless tobacco: Never Used  . Tobacco comment: has the chantix but hasn't committed to stopping smoking yet  Substance and Sexual Activity  . Alcohol use: Yes    Alcohol/week: 0.0 oz    Comment: Occasional  . Drug use:  No  . Sexual activity: Not on file  Lifestyle  . Physical activity:    Days per week: Not on file    Minutes per session: Not on file  . Stress: Not on file  Relationships  . Social connections:    Talks on phone: Not on file    Gets together: Not on file    Attends religious service: Not on file    Active member of club or organization: Not on file    Attends meetings of clubs or organizations: Not on file    Relationship status: Not on file  Other Topics Concern  . Not on file  Social History Narrative   Relocated from [place] 3 years ago. Married in 10/10. Lives in [place].    Family History  Problem Relation Age of Onset  . Hypertension Father   . Diabetes Father   . Cancer Father        blood  . Hypertension Mother   . Colon cancer Unknown 87       grandfather    Health Maintenance  Topic Date Due  . INFLUENZA VACCINE  07/20/2018  . PAP SMEAR  12/21/2019  . TETANUS/TDAP  09/23/2021  . HIV Screening  Completed    -----------------------------------------------------------------------------------------------------------------------------------------------------------------------------------------------------------------  Physical Exam BP 130/82   Pulse (!) 59   Physical Exam  Constitutional: She is oriented to person, place, and time. She appears well-nourished. No distress.  HENT:  Head: Normocephalic and atraumatic.  Mouth/Throat: Oropharynx is clear and moist.  Abdominal: Soft. Bowel sounds are normal. She exhibits no distension. There is no tenderness.  Genitourinary: Pelvic exam was performed with patient prone. Cervix exhibits no discharge. No erythema, tenderness or bleeding in the vagina. Vaginal discharge (white, thick vaginal discharge) found.  Genitourinary Comments: Mild labial erythema with white discharge along labia minora.  No lesions noted along labia. No discharge noted from cervical os and cervix with normal appearance Cervical os and  vagina swabbed with aptima swab    Exam chaperoned by Rolly Salter, RMA  Neurological: She is alert and oriented to person, place, and time.  Psychiatric: She has a normal mood and affect. Her behavior is normal.    ------------------------------------------------------------------------------------------------------------------------------------------------------------------------------------------------------------------- Assessment and Plan  Vaginal discharge Discharge with appearance of yeast however with history of recurrent BV will await return of aptima swab Testing for GC/Chlamydia/trich also done.

## 2018-05-18 NOTE — Patient Instructions (Signed)
We'll let you know once this test returns.   Vaginitis Vaginitis is an inflammation of the vagina. It can happen when the normal bacteria and yeast in the vagina grow too much. There are different types. Treatment will depend on the type you have. Follow these instructions at home:  Take all medicines as told by your doctor.  Keep your vagina area clean and dry. Avoid soap. Rinse the area with water.  Avoid washing and cleaning out the vagina (douching).  Do not use tampons or have sex (intercourse) until your treatment is done.  Wipe from front to back after going to the restroom.  Wear cotton underwear.  Avoid wearing underwear while you sleep until your vaginitis is gone.  Avoid tight pants. Avoid underwear or nylons without a cotton panel.  Take off wet clothing (such as a bathing suit) as soon as you can.  Use mild, unscented products. Avoid fabric softeners and scented: ? Feminine sprays. ? Laundry detergents. ? Tampons. ? Soaps or bubble baths.  Practice safe sex and use condoms. Get help right away if:  You have belly (abdominal) pain.  You have a fever or lasting symptoms for more than 2-3 days.  You have a fever and your symptoms suddenly get worse. This information is not intended to replace advice given to you by your health care provider. Make sure you discuss any questions you have with your health care provider. Document Released: 03/04/2009 Document Revised: 05/13/2016 Document Reviewed: 05/18/2012 Elsevier Interactive Patient Education  2017 [employer].

## 2018-05-22 LAB — CERVICOVAGINAL ANCILLARY ONLY
BACTERIAL VAGINITIS: NEGATIVE
CHLAMYDIA, DNA PROBE: NEGATIVE
Candida vaginitis: NEGATIVE
NEISSERIA GONORRHEA: NEGATIVE
Trichomonas: NEGATIVE

## 2018-05-24 NOTE — Progress Notes (Signed)
Testing for Bv, Yeast, and STD testing are all negative.  If she continues to experience symptoms please let me know.

## 2019-02-08 ENCOUNTER — Encounter: Payer: Self-pay | Admitting: Family Medicine

## 2019-02-08 ENCOUNTER — Ambulatory Visit (INDEPENDENT_AMBULATORY_CARE_PROVIDER_SITE_OTHER): Payer: Managed Care, Other (non HMO) | Admitting: Family Medicine

## 2019-02-08 VITALS — BP 108/66 | HR 65 | Temp 98.4°F | Ht 68.0 in | Wt 213.3 lb

## 2019-02-08 DIAGNOSIS — F17200 Nicotine dependence, unspecified, uncomplicated: Secondary | ICD-10-CM

## 2019-02-08 DIAGNOSIS — J019 Acute sinusitis, unspecified: Secondary | ICD-10-CM | POA: Insufficient documentation

## 2019-02-08 DIAGNOSIS — M545 Low back pain, unspecified: Secondary | ICD-10-CM

## 2019-02-08 DIAGNOSIS — J0110 Acute frontal sinusitis, unspecified: Secondary | ICD-10-CM

## 2019-02-08 DIAGNOSIS — L0293 Carbuncle, unspecified: Secondary | ICD-10-CM

## 2019-02-08 LAB — POC URINALSYSI DIPSTICK (AUTOMATED)
Bilirubin, UA: NEGATIVE
Blood, UA: NEGATIVE
Glucose, UA: NEGATIVE
KETONES UA: NEGATIVE
LEUKOCYTES UA: NEGATIVE
Nitrite, UA: NEGATIVE
PH UA: 6 (ref 5.0–8.0)
Protein, UA: NEGATIVE
Spec Grav, UA: 1.015 (ref 1.010–1.025)
Urobilinogen, UA: 0.2 E.U./dL

## 2019-02-08 MED ORDER — OMEPRAZOLE 20 MG PO CPDR: 20.0000 mg | DELAYED_RELEASE_CAPSULE | Freq: Every day | ORAL | 0 refills | Status: AC | PRN

## 2019-02-08 MED ORDER — DOXYCYCLINE HYCLATE 100 MG PO TABS: 100.0000 mg | ORAL_TABLET | Freq: Two times a day (BID) | ORAL | 0 refills | Status: DC

## 2019-02-08 NOTE — Progress Notes (Signed)
Subjective:    Patient ID: Beverly Moore, female    DOB: 08-Jan-1981, 38 y.o.   MRN: [id number]  HPI Here for cough/congestion and ST  Has been congested 10 days  Worse now  Ears feel funny and full  Yesterday sinuses became plugged and painful  ST also  pnd  Some sneezing  No known fever  Some chills here and there  No sweats  ? If body aches -hard to tell   Pain in face - above eyes / both sides equally  Also has a migraine   Cough - has started this am  Worse with talking  Dry cough  A little wheezing  Has not used her inhaler   Some loose stool   otc : tylenol for congestion , mucinex (plain)  Needs to drink more fluids    On humera- is immunocompromised   Low back pain  Has only one kidney  No urinary frequency or pain or blood in urine  Results for orders placed or performed in visit on 02/08/19  POCT Urinalysis Dipstick (Automated)  Result Value Ref Range   Color, UA Yellow    Clarity, UA Clear    Glucose, UA Negative Negative   Bilirubin, UA Negative    Ketones, UA Negative    Spec Grav, UA 1.015 1.010 - 1.025   Blood, UA Negative    pH, UA 6.0 5.0 - 8.0   Protein, UA Negative Negative   Urobilinogen, UA 0.2 0.2 or 1.0 E.U./dL   Nitrite, UA Negative    Leukocytes, UA Negative Negative      Also needs refill of her doxycycline for boils  Current smoker  Needs refill of omeprazole - takes infrequently   Patient Active Problem List   Diagnosis Date Noted  . Acute sinusitis 02/08/2019  . Low back pain 02/08/2019  . Fatigue 10/24/2017  . Visual changes 10/24/2017  . Depression 01/22/2016  . Pain of left great toe 01/22/2016  . Vaginal discharge 08/04/2012  . Allergic rhinitis 07/02/2011  . TOBACCO ABUSE 06/11/2010  . AMENORRHEA 04/17/2010  . DYSPHAGIA UNSPECIFIED 04/17/2010  . GENITAL HERPES 01/16/2010  . RECTAL FISSURE 10/24/2009  . PSORIASIS 10/24/2009   Past Medical History:  Diagnosis Date  . Absence of menstruation   .  Acute bronchitis   . Anal fissure   . BV (bacterial vaginosis)    recurrent  . Cellulitis and abscess of unspecified site   . Dysphagia, unspecified(787.20)   . Genital herpes, unspecified   . Other acute sinusitis   . Other psoriasis   . Screening for lipoid disorders   . Tobacco use disorder    History reviewed. No pertinent surgical history. Social History   Tobacco Use  . Smoking status: Current Every Day Smoker    Packs/day: 0.50    Types: Cigarettes  . Smokeless tobacco: Never Used  . Tobacco comment: has the chantix but hasn't committed to stopping smoking yet  Substance Use Topics  . Alcohol use: Yes    Alcohol/week: 0.0 standard drinks    Comment: Occasional  . Drug use: No   Family History  Problem Relation Age of Onset  . Hypertension Father   . Diabetes Father   . Cancer Father        blood  . Hypertension Mother   . Colon cancer Unknown 50       grandfather   No Known Allergies Current Outpatient Medications on File Prior to Visit  Medication Sig Dispense Refill  .  albuterol (PROVENTIL HFA;VENTOLIN HFA) 108 (90 Base) MCG/ACT inhaler Inhale 2 puffs into the lungs every 6 (six) hours as needed. 1 Inhaler 0  . aspirin-acetaminophen-caffeine (EXCEDRIN MIGRAINE) 250-250-65 MG per tablet Take 2-3 tablets by mouth every 6 (six) hours as needed for headache.    . cetirizine (ZYRTEC) 10 MG tablet Take 10 mg by mouth daily.    Marland Kitchen escitalopram (LEXAPRO) 10 MG tablet TAKE 1 TABLET (10 MG TOTAL) BY MOUTH DAILY. 90 tablet 1  . fluticasone (FLONASE) 50 MCG/ACT nasal spray Place 2 sprays into both nostrils daily.    Marland Kitchen HUMIRA PEN 40 MG/0.8ML PNKT     . montelukast (SINGULAIR) 10 MG tablet Take 1 tablet (10 mg total) at bedtime by mouth. 30 tablet 11   No current facility-administered medications on file prior to visit.     Review of Systems  Constitutional: Positive for appetite change. Negative for fatigue and fever.  HENT: Positive for congestion, ear pain, postnasal  drip, rhinorrhea, sinus pressure and sore throat. Negative for nosebleeds and voice change.   Eyes: Negative for pain, redness and itching.  Respiratory: Positive for cough. Negative for chest tightness, shortness of breath and wheezing.   Cardiovascular: Negative for chest pain.  Gastrointestinal: Negative for abdominal pain, diarrhea, nausea and vomiting.  Endocrine: Negative for polyuria.  Genitourinary: Negative for dysuria, frequency and urgency.  Musculoskeletal: Positive for back pain. Negative for arthralgias and myalgias.  Allergic/Immunologic: Negative for immunocompromised state.  Neurological: Positive for headaches. Negative for dizziness, tremors, syncope, weakness and numbness.  Hematological: Negative for adenopathy. Does not bruise/bleed easily.  Psychiatric/Behavioral: Negative for dysphoric mood. The patient is not nervous/anxious.        Objective:   Physical Exam Constitutional:      General: She is not in acute distress.    Appearance: Normal appearance. She is well-developed. She is obese. She is not ill-appearing.  HENT:     Head: Normocephalic and atraumatic.     Comments: Bilateral maxillary and frontal sinus tenderness    Right Ear: Tympanic membrane, ear canal and external ear normal.     Left Ear: Tympanic membrane, ear canal and external ear normal.     Nose: Congestion and rhinorrhea present.     Mouth/Throat:     Mouth: Mucous membranes are moist.     Pharynx: Oropharynx is clear. No oropharyngeal exudate or posterior oropharyngeal erythema.     Comments: Clear pnd Eyes:     General:        Right eye: No discharge.        Left eye: No discharge.     Conjunctiva/sclera: Conjunctivae normal.     Pupils: Pupils are equal, round, and reactive to light.  Neck:     Musculoskeletal: Normal range of motion and neck supple.  Cardiovascular:     Rate and Rhythm: Normal rate and regular rhythm.     Heart sounds: Normal heart sounds.  Pulmonary:      Effort: Pulmonary effort is normal. No respiratory distress.     Breath sounds: Normal breath sounds. No wheezing or rales.     Comments: Good air exch No rales or rhonchi bs are mildly distant No wheeze Lymphadenopathy:     Cervical: No cervical adenopathy.  Skin:    General: Skin is warm and dry.     Findings: No rash.  Neurological:     Mental Status: She is alert.     Cranial Nerves: No cranial nerve deficit.  Coordination: Coordination normal.     Deep Tendon Reflexes: Reflexes normal.  Psychiatric:        Mood and Affect: Mood normal.           Assessment & Plan:   Problem List Items Addressed This Visit      Respiratory   Acute sinusitis - Primary    Smoking cessation again encouraged  Px doxycycline bid 10 d (should also help her boils)  Symptomatic care-flonase/saline , delsym for cough  Update if not starting to improve in a week or if worsening        Relevant Medications   doxycycline (VIBRA-TABS) 100 MG tablet     Other   TOBACCO ABUSE    Disc in detail risks of smoking and possible outcomes including copd, vascular/ heart disease, cancer , respiratory and sinus infections  Pt voices understanding She is not ready to quit       Low back pain    UA is negative Most likely msk in origin      Relevant Orders   POCT Urinalysis Dipstick (Automated) (Completed)   Recurrent boils    The doxy px today for her sinusitis should cover problems now  She needs to f/u with her dermatologist for chronic px

## 2019-02-08 NOTE — Assessment & Plan Note (Signed)
The doxy px today for her sinusitis should cover problems now  She needs to f/u with her dermatologist for chronic px

## 2019-02-08 NOTE — Assessment & Plan Note (Signed)
UA is negative Most likely msk in origin

## 2019-02-08 NOTE — Assessment & Plan Note (Signed)
Smoking cessation again encouraged  Px doxycycline bid 10 d (should also help her boils)  Symptomatic care-flonase/saline , delsym for cough  Update if not starting to improve in a week or if worsening

## 2019-02-08 NOTE — Assessment & Plan Note (Signed)
Disc in detail risks of smoking and possible outcomes including copd, vascular/ heart disease, cancer , respiratory and sinus infections  Pt voices understanding She is not ready to quit  

## 2019-02-08 NOTE — Patient Instructions (Addendum)
Drink lots of fluids Rest when you can  Get back on flonase daily  Take the doxycycline as directed for 10 days   For cough try delsym over the counter   Nasal saline spray and breathing steam can also help congestion   Keep thinking about quitting smoking   Update if not starting to improve in a week or if worsening    We will get a urine sample on the way out and let you know later if we need to do anything

## 2019-02-20 ENCOUNTER — Other Ambulatory Visit: Payer: Self-pay | Admitting: Family Medicine

## 2019-02-21 MED ORDER — ALBUTEROL SULFATE HFA 108 (90 BASE) MCG/ACT IN AERS: 2.0000 | INHALATION_SPRAY | Freq: Four times a day (QID) | RESPIRATORY_TRACT | 0 refills | Status: DC | PRN

## 2019-03-25 ENCOUNTER — Other Ambulatory Visit: Payer: Self-pay | Admitting: Family Medicine

## 2019-04-20 ENCOUNTER — Telehealth: Payer: Self-pay | Admitting: Family Medicine

## 2019-04-20 NOTE — Telephone Encounter (Signed)
Called pt because it has been a while since last visit with Dr Dayton Martes so I wasn't sure if she would like to schedule a virtual visit with her to follow up or if she needed a CPE it would be sometime around July when we would see again

## 2019-06-24 ENCOUNTER — Other Ambulatory Visit: Payer: Self-pay | Admitting: Family Medicine

## 2019-08-12 NOTE — Progress Notes (Signed)
PCP:  Dianne DunAron, Talia M, MD   Chief Complaint  Patient presents with  . Gynecologic Exam     HPI:      [name] is a 38 y.o. No obstetric history on file. who LMP was Patient's last menstrual period was 08/13/2019 (exact date)., presents today for her annual examination.  Her menses are regular every 28-30 days, lasting 4-7 days, mod flow.  Dysmenorrhea none. She does not have intermenstrual bleeding.  Sex activity: single partner, contraception - none, declines BC. Conception ok. Not taking PNVs. Last Pap: 11/05/16  Results were: no abnormalities /neg HPV DNA  Hx of STDs: HSV (no meds needed), HPV on pap Pt notes many yrs of vaginal d/c with body odor scent. Always feels "disgusting". Uses unscented soap, no dryer sheets, scented detergent. D/C changes throughout her cycle.   There is no FH of breast cancer. There is no FH of ovarian cancer. The patient does do self-breast exams.  Tobacco use: The patient currently smokes 1 packs of cigarettes per day for the past many years. Trying to quit Alcohol use: social drinker No drug use.  Exercise: moderately active  She does not get adequate calcium and Vitamin D in her diet.  Pt notes RLQ sharp pain when bending over, that resolves with standing back up. No other pelvic pain sx, no GI sx. Pt also states her mouth feels burned. No erythema, lesions, tongue involvement. Still is able to taste normally. Hx of dry mouth, used to use biotene. Spicy foods increase sx. Not taking MVI.   Past Medical History:  Diagnosis Date  . Absence of menstruation   . Acute bronchitis   . Anal fissure   . BV (bacterial vaginosis)    recurrent  . Cellulitis and abscess of unspecified site   . Dysphagia, unspecified(787.20)   . Genital herpes, unspecified   . Other acute sinusitis   . Other psoriasis   . Screening for lipoid disorders   . Tobacco use disorder     History reviewed. No pertinent surgical history.  Family History   Problem Relation Age of Onset  . Hypertension Father   . Diabetes Father   . Cancer Father        blood  . Hypertension Mother   . Colon cancer Maternal Grandfather     Social History   Socioeconomic History  . Marital status: Married    Spouse name: Not on file  . Number of children: Not on file  . Years of education: Not on file  . Highest education level: Not on file  Occupational History  . 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 Financial resource strain: Not on file  . Food insecurity    Worry: Not on file    Inability: Not on file  . Transportation needs    Medical: Not on file    Non-medical: Not on file  Tobacco Use  . Smoking status: Current Every Day Smoker    Packs/day: 0.50    Types: Cigarettes  . Smokeless tobacco: Never Used  . Tobacco comment: has the chantix but hasn't committed to stopping smoking yet  Substance and Sexual Activity  . Alcohol use: Yes    Alcohol/week: 0.0 standard drinks    Comment: Occasional  . Drug use: No  . Sexual activity: Yes    Birth control/protection: None  Lifestyle  . Physical activity    Days per week: Not on file    Minutes per  session: Not on file  . Stress: Not on file  Relationships  . Social Musicianconnections    Talks on phone: Not on file    Gets together: Not on file    Attends religious service: Not on file    Active member of club or organization: Not on file    Attends meetings of clubs or organizations: Not on file    Relationship status: Not on file  . Intimate partner violence    Fear of current or ex partner: Not on file    Emotionally abused: Not on file    Physically abused: Not on file    Forced sexual activity: Not on file  Other Topics Concern  . Not on file  Social History Narrative   [place] ago. Mar[place] 10/10[place]    Outpatient Medications Prior to Visit  Medication Sig Dispense Refill  . albuterol (VENTOLIN HFA) 108 (90 Base)  MCG/ACT inhaler INHALE 2 PUFFS BY MOUTH EVERY 6 HOURS AS NEEDED 8.5 g 2  . aspirin-acetaminophen-caffeine (EXCEDRIN MIGRAINE) 250-250-65 MG per tablet Take 2-3 tablets by mouth every 6 (six) hours as needed for headache.    . cetirizine (ZYRTEC) 10 MG tablet Take 10 mg by mouth daily.    Marland Kitchen. doxycycline (VIBRA-TABS) 100 MG tablet Take 1 tablet (100 mg total) by mouth 2 (two) times daily. 20 tablet 0  . fluocinonide (LIDEX) 0.05 % external solution APPLY 1ML TO THE SCALP NIGHTLY AS NEEDED FOR PSORIASIS    . fluticasone (FLONASE) 50 MCG/ACT nasal spray Place 2 sprays into both nostrils daily.    Marland Kitchen. HUMIRA PEN 40 MG/0.8ML PNKT     . montelukast (SINGULAIR) 10 MG tablet Take 1 tablet (10 mg total) at bedtime by mouth. 30 tablet 11  . omeprazole (PRILOSEC) 20 MG capsule Take 1 capsule (20 mg total) by mouth daily as needed. 30 capsule 0  . escitalopram (LEXAPRO) 10 MG tablet TAKE 1 TABLET (10 MG TOTAL) BY MOUTH DAILY. 90 tablet 1   No facility-administered medications prior to visit.       ROS:  Review of Systems  Constitutional: Negative for fatigue, fever and unexpected weight change.  Respiratory: Negative for cough, shortness of breath and wheezing.   Cardiovascular: Negative for chest pain, palpitations and leg swelling.  Gastrointestinal: Negative for blood in stool, constipation, diarrhea, nausea and vomiting.  Endocrine: Negative for cold intolerance, heat intolerance and polyuria.  Genitourinary: Positive for vaginal discharge. Negative for dyspareunia, dysuria, flank pain, frequency, genital sores, hematuria, menstrual problem, pelvic pain, urgency, vaginal bleeding and vaginal pain.  Musculoskeletal: Negative for back pain, joint swelling and myalgias.  Skin: Negative for rash.  Neurological: Negative for dizziness, syncope, light-headedness, numbness and headaches.  Hematological: Negative for adenopathy.  Psychiatric/Behavioral: Negative for agitation, confusion, sleep disturbance  and suicidal ideas. The patient is not nervous/anxious.    BREAST: No symptoms   Objective: BP 134/90   Ht 5\' 9"  (1.753 m)   Wt 212 lb 6.4 oz (96.3 kg)   LMP 08/13/2019 (Exact Date)   BMI 31.37 kg/m    Physical Exam Constitutional:      Appearance: She is well-developed.  Genitourinary:     Vulva, cervix, uterus, right adnexa and left adnexa normal.     No vulval lesion or tenderness noted.     Vaginal bleeding present.     No vaginal discharge, erythema or tenderness.     No cervical polyp.  Uterus is not enlarged or tender.     No right or left adnexal mass present.     Right adnexa not tender.     Left adnexa not tender.  Neck:     Musculoskeletal: Normal range of motion.     Thyroid: No thyromegaly.  Cardiovascular:     Rate and Rhythm: Normal rate and regular rhythm.     Heart sounds: Normal heart sounds. No murmur.  Pulmonary:     Effort: Pulmonary effort is normal.     Breath sounds: Normal breath sounds.  Chest:     Breasts:        Right: No mass, nipple discharge, skin change or tenderness.        Left: No mass, nipple discharge, skin change or tenderness.  Abdominal:     Palpations: Abdomen is soft.     Tenderness: There is no abdominal tenderness. There is no guarding.  Musculoskeletal: Normal range of motion.  Neurological:     General: No focal deficit present.     Mental Status: She is alert and oriented to person, place, and time.     Cranial Nerves: No cranial nerve deficit.  Skin:    General: Skin is warm and dry.  Psychiatric:        Mood and Affect: Mood normal.        Behavior: Behavior normal.        Thought Content: Thought content normal.        Judgment: Judgment normal.  Vitals signs reviewed.     Results: Results for orders placed or performed in visit on 08/13/19 (from the past 24 hour(s))  POCT Wet Prep with KOH     Status: Normal   Collection Time: 08/13/19  8:58 AM  Result Value Ref Range   Trichomonas, UA Negative     Clue Cells Wet Prep HPF POC neg    Epithelial Wet Prep HPF POC     Yeast Wet Prep HPF POC neg    Bacteria Wet Prep HPF POC     RBC Wet Prep HPF POC     WBC Wet Prep HPF POC     KOH Prep POC Negative Negative    Assessment/Plan: Encounter for annual routine gynecological examination  Cervical cancer screening - Plan: Cytology - PAP  Screening for HPV (human papillomavirus) - Plan: Cytology - PAP  RLQ abdominal pain--Neg exam. Question MSK since with bending over only. F/u prn.  Vaginal odor - Plan: POCT Wet Prep with KOH; neg wet prep. Question sweat glands vaginally and not d/c. Use unscented detergent. F/u prn.   Sore mouth--Question etiology. Resume biotene/add MVI in case vit deficiency. F/u with PCP prn.  TOBACCO ABUSE            GYN counsel adequate intake of calcium and vitamin D, diet and exercise     F/U  Return in about 1 year (around 08/12/2020).  Beverly B. Copland, PA-C 08/13/2019 9:01 AM

## 2019-08-13 ENCOUNTER — Ambulatory Visit (INDEPENDENT_AMBULATORY_CARE_PROVIDER_SITE_OTHER): Payer: Managed Care, Other (non HMO) | Admitting: Obstetrics and Gynecology

## 2019-08-13 ENCOUNTER — Other Ambulatory Visit: Payer: Self-pay

## 2019-08-13 ENCOUNTER — Other Ambulatory Visit (HOSPITAL_COMMUNITY)
Admission: RE | Admit: 2019-08-13 | Discharge: 2019-08-13 | Disposition: A | Discharge status: Home / Self Care | Payer: Managed Care, Other (non HMO) | Source: Ambulatory Visit | Attending: Obstetrics and Gynecology | Admitting: Obstetrics and Gynecology

## 2019-08-13 ENCOUNTER — Encounter: Payer: Self-pay | Admitting: Obstetrics and Gynecology

## 2019-08-13 VITALS — BP 134/90 | Ht 69.0 in | Wt 212.4 lb

## 2019-08-13 DIAGNOSIS — Z1151 Encounter for screening for human papillomavirus (HPV): Secondary | ICD-10-CM | POA: Diagnosis present

## 2019-08-13 DIAGNOSIS — Z01419 Encounter for gynecological examination (general) (routine) without abnormal findings: Secondary | ICD-10-CM

## 2019-08-13 DIAGNOSIS — N898 Other specified noninflammatory disorders of vagina: Secondary | ICD-10-CM | POA: Diagnosis not present

## 2019-08-13 DIAGNOSIS — Z124 Encounter for screening for malignant neoplasm of cervix: Secondary | ICD-10-CM | POA: Insufficient documentation

## 2019-08-13 DIAGNOSIS — F17200 Nicotine dependence, unspecified, uncomplicated: Secondary | ICD-10-CM

## 2019-08-13 DIAGNOSIS — K1379 Other lesions of oral mucosa: Secondary | ICD-10-CM

## 2019-08-13 DIAGNOSIS — R1031 Right lower quadrant pain: Secondary | ICD-10-CM

## 2019-08-13 LAB — POCT WET PREP WITH KOH
Clue Cells Wet Prep HPF POC: NEGATIVE
KOH Prep POC: NEGATIVE
Trichomonas, UA: NEGATIVE
Yeast Wet Prep HPF POC: NEGATIVE

## 2019-08-13 NOTE — Patient Instructions (Signed)
I value your feedback and entrusting us with your care. If you get a [facility] patient survey, I would appreciate you taking the time to let us know about your experience today. Thank you! 

## 2019-08-14 LAB — CYTOLOGY - PAP
Adequacy: ABSENT
Diagnosis: NEGATIVE
HPV: NOT DETECTED

## 2019-09-24 ENCOUNTER — Ambulatory Visit
Admission: EM | Admit: 2019-09-24 | Discharge: 2019-09-24 | Disposition: A | Discharge status: Home / Self Care | Payer: Managed Care, Other (non HMO) | Attending: Emergency Medicine | Admitting: Emergency Medicine

## 2019-09-24 DIAGNOSIS — F17210 Nicotine dependence, cigarettes, uncomplicated: Secondary | ICD-10-CM | POA: Diagnosis not present

## 2019-09-24 DIAGNOSIS — N3000 Acute cystitis without hematuria: Secondary | ICD-10-CM | POA: Diagnosis not present

## 2019-09-24 DIAGNOSIS — B9689 Other specified bacterial agents as the cause of diseases classified elsewhere: Secondary | ICD-10-CM | POA: Diagnosis not present

## 2019-09-24 DIAGNOSIS — J0100 Acute maxillary sinusitis, unspecified: Secondary | ICD-10-CM | POA: Diagnosis not present

## 2019-09-24 LAB — POCT URINALYSIS DIP (MANUAL ENTRY)
Bilirubin, UA: NEGATIVE
Blood, UA: NEGATIVE
Glucose, UA: NEGATIVE mg/dL
Ketones, POC UA: NEGATIVE mg/dL
Nitrite, UA: NEGATIVE
Spec Grav, UA: 1.02 (ref 1.010–1.025)
Urobilinogen, UA: 0.2 E.U./dL
pH, UA: 6.5 (ref 5.0–8.0)

## 2019-09-24 LAB — POCT URINE PREGNANCY: Preg Test, Ur: NEGATIVE

## 2019-09-24 MED ORDER — AMOXICILLIN-POT CLAVULANATE 875-125 MG PO TABS: 1.0000 | ORAL_TABLET | Freq: Two times a day (BID) | ORAL | 0 refills | Status: DC

## 2019-09-24 MED ORDER — FLUCONAZOLE 200 MG PO TABS: 200.0000 mg | ORAL_TABLET | Freq: Once | ORAL | 0 refills | Status: AC

## 2019-09-24 NOTE — ED Triage Notes (Signed)
Pt c/o nasal and sinus pressure for over 2wks, since Sunday more pressure and productive cough with yellow sputum. Pt states also having pressure and frequency when urinating x1wk

## 2019-09-24 NOTE — Discharge Instructions (Addendum)
Or to take your allergy medication every day, including your intranasal steroid (Flonase, Nasacort). Return for worsening facial pain, cough, shortness of breath, fever.

## 2019-09-24 NOTE — ED Provider Notes (Signed)
[facility]    CSN: [id number] Arrival date & time: 09/24/19  1147      History   Chief Complaint Chief Complaint  Patient presents with  . Nasal Congestion  . Urinary Tract Infection    HPI Beverly Moore is a 38 y.o. female presenting for 2-week course of nasal congestion, sinus pressure.  States that she developed a productive, non-hemoptic cough Sunday that is worse in the morning.  She has been taking daily antihistamines without significant relief.  Today's visit was prompted largely by worsening facial pain.  Patient also noting urinary frequency for the last week.  Denies history of recurrent UTI, renal calculi, pyelonephritis.  Has not tried anything for urinary symptoms.    Past Medical History:  Diagnosis Date  . Absence of menstruation   . Acute bronchitis   . Anal fissure   . BV (bacterial vaginosis)    recurrent  . Cellulitis and abscess of unspecified site   . Dysphagia, unspecified(787.20)   . Genital herpes, unspecified   . Other acute sinusitis   . Other psoriasis   . Screening for lipoid disorders   . Tobacco use disorder     Patient Active Problem List   Diagnosis Date Noted  . Vaginal odor 08/13/2019  . Acute sinusitis 02/08/2019  . Low back pain 02/08/2019  . Recurrent boils 02/08/2019  . Fatigue 10/24/2017  . Visual changes 10/24/2017  . Depression 01/22/2016  . Pain of left great toe 01/22/2016  . Vaginal discharge 08/04/2012  . Allergic rhinitis 07/02/2011  . TOBACCO ABUSE 06/11/2010  . AMENORRHEA 04/17/2010  . DYSPHAGIA UNSPECIFIED 04/17/2010  . GENITAL HERPES 01/16/2010  . RECTAL FISSURE 10/24/2009  . PSORIASIS 10/24/2009    History reviewed. No pertinent surgical history.  OB History    Gravida  0   Para  0   Term  0   Preterm  0   AB  0   Living  0     SAB  0   TAB  0   Ectopic  0   Multiple  0   Live Births  0            Home Medications    Prior to Admission medications   Medication  Sig Start Date End Date Taking? Authorizing Provider  albuterol (VENTOLIN HFA) 108 (90 Base) MCG/ACT inhaler INHALE 2 PUFFS BY MOUTH EVERY 6 HOURS AS NEEDED 06/27/19   Lucille Passy, MD  amoxicillin-clavulanate (AUGMENTIN) 875-125 MG tablet Take 1 tablet by mouth every 12 (twelve) hours. 09/24/19   Hall-Potvin, [place], PA-C  aspirin-acetaminophen-caffeine (EXCEDRIN MIGRAINE) [phone] MG per tablet Take 2-3 tablets by mouth every 6 (six) hours as needed for headache.    [provider]  cetirizine (ZYRTEC) 10 MG tablet Take 10 mg by mouth daily.    [provider]  doxycycline (VIBRA-TABS) 100 MG tablet Take 1 tablet (100 mg total) by mouth 2 (two) times daily. 02/08/19   Tower, Wynelle Fanny, MD  fluconazole (DIFLUCAN) 200 MG tablet Take 1 tablet (200 mg total) by mouth once for 1 dose. May repeat in 72 hours if needed 09/24/19 09/24/19  Hall-Potvin, [place], PA-C  fluocinonide (LIDEX) 0.05 % external solution APPLY 1ML TO THE SCALP NIGHTLY AS NEEDED FOR PSORIASIS 05/31/19   [provider]  fluticasone (FLONASE) 50 MCG/ACT nasal spray Place 2 sprays into both nostrils daily.    [provider]  HUMIRA PEN 40 MG/0.8ML PNKT  10/11/17   [provider]  omeprazole (PRILOSEC) 20 MG capsule Take 1 capsule (20 mg total) by mouth daily as needed. 02/08/19   Tower, Audrie GallusMarne A, MD    Family History Family History  Problem Relation Age of Onset  . Hypertension Father   . Diabetes Father   . Cancer Father        blood  . Hypertension Mother   . Colon cancer Maternal Grandfather     Social History Social History   Tobacco Use  . Smoking status: Current Every Day Smoker    Packs/day: 0.50    Types: Cigarettes  . Smokeless tobacco: Never Used  . Tobacco comment: has the chantix but hasn't committed to stopping smoking yet  Substance Use Topics  . Alcohol use: Yes    Alcohol/week: 0.0 standard drinks    Comment: Occasional  . Drug use: No     Allergies    Patient has no known allergies.   Review of Systems Review of Systems  Constitutional: Negative for activity change, appetite change, fatigue and fever.  HENT: Positive for congestion and sinus pain. Negative for dental problem, ear discharge, ear pain, facial swelling, hearing loss, sore throat, trouble swallowing and voice change.   Eyes: Negative for photophobia, pain, redness and visual disturbance.  Respiratory: Negative for cough and shortness of breath.   Cardiovascular: Negative for chest pain, palpitations and leg swelling.  Gastrointestinal: Negative for abdominal pain, diarrhea and vomiting.  Genitourinary: Positive for frequency. Negative for dysuria and hematuria.  Musculoskeletal: Negative for arthralgias and myalgias.  Skin: Negative for rash and wound.  Neurological: Negative for dizziness, syncope and headaches.     Physical Exam Triage Vital Signs ED Triage Vitals  Enc Vitals Group     BP 09/24/19 1200 (!) 164/105     Pulse Rate 09/24/19 1200 74     Resp 09/24/19 1200 18     Temp 09/24/19 1200 98.7 F (37.1 C)     Temp Source 09/24/19 1200 Oral     SpO2 09/24/19 1200 98 %     Weight --      Height --      Head Circumference --      Peak Flow --      Pain Score 09/24/19 1201 8     Pain Loc --      Pain Edu? --      Excl. in GC? --    No data found.  Updated Vital Signs BP (!) 164/105 (BP Location: Left Arm)   Pulse 74   Temp 98.7 F (37.1 C) (Oral)   Resp 18   LMP 09/05/2019   SpO2 98%    Physical Exam Constitutional:      General: She is not in acute distress.    Appearance: She is not toxic-appearing.  HENT:     Head: Normocephalic and atraumatic.     Jaw: There is normal jaw occlusion. No tenderness or pain on movement.     Right Ear: Hearing, tympanic membrane, ear canal and external ear normal. No tenderness. No mastoid tenderness.     Left Ear: Hearing, tympanic membrane, ear canal and external ear normal. No tenderness. No mastoid  tenderness.     Nose: No nasal deformity, septal deviation or nasal tenderness.     Right Turbinates: Not swollen or pale.     Left Turbinates: Not swollen or pale.     Right Sinus: No maxillary sinus tenderness or frontal sinus tenderness.     Left Sinus: No maxillary sinus  tenderness or frontal sinus tenderness.     Comments: Bilateral turbinate edema with mucosal injection, scant mucoid discharge.  Tender maxillary sinuses bilaterally    Mouth/Throat:     Lips: Pink. No lesions.     Mouth: Mucous membranes are moist. No injury.     Pharynx: Oropharynx is clear. Uvula midline. No posterior oropharyngeal erythema or uvula swelling.     Comments: no tonsillar exudate or hypertrophy Eyes:     General: No scleral icterus.    Conjunctiva/sclera: Conjunctivae normal.     Pupils: Pupils are equal, round, and reactive to light.  Neck:     Musculoskeletal: Normal range of motion and neck supple. No muscular tenderness.  Cardiovascular:     Rate and Rhythm: Normal rate and regular rhythm.     Heart sounds: No murmur. No gallop.      Comments: Repeat BP per provider: 138/90 Pulmonary:     Effort: Pulmonary effort is normal. No respiratory distress.     Breath sounds: No wheezing.  Abdominal:     General: Abdomen is flat. Bowel sounds are normal.     Tenderness: There is no abdominal tenderness. There is no right CVA tenderness or left CVA tenderness.  Lymphadenopathy:     Cervical: No cervical adenopathy.  Skin:    Capillary Refill: Capillary refill takes less than 2 seconds.  Neurological:     General: No focal deficit present.     Mental Status: She is alert and oriented to person, place, and time.      UC Treatments / Results  Labs (all labs ordered are listed, but only abnormal results are displayed) Labs Reviewed  POCT URINALYSIS DIP (MANUAL ENTRY) - Abnormal; Notable for the following components:      Result Value   Protein Ur, POC trace (*)    Leukocytes, UA Trace (*)     All other components within normal limits  POCT URINE PREGNANCY - Normal  URINE CULTURE    EKG   Radiology No results found.  Procedures Procedures (including critical care time)  Medications Ordered in UC Medications - No data to display  Initial Impression / Assessment and Plan / UC Course  I have reviewed the triage vital signs and the nursing notes.  Pertinent labs & imaging results that were available during my care of the patient were reviewed by me and considered in my medical decision making (see chart for details).     1.  Acute cystitis without hematuria Urine pregnancy done in office, reviewed by me: Negative.  POCT urine dipstick reviewed by me: Positive for trace protein, leukocytes.  Culture pending.  Given symptoms, will treat for acute cystitis.  2.  Acute nonrecurrent maxillary sinusitis Given history, exam findings will treat empirically for ABS.  This provider choosing Augmentin, reviewed with that this can also treat UTI in some cases, though will call if need to change antibiotics based on culture.  Patient requesting Diflucan as she gets yeast infections s/p antibiotic use: Rx sent.  Return precautions discussed, patient verbalized understanding and is agreeable to plan. Final Clinical Impressions(s) / UC Diagnoses   Final diagnoses:  Acute cystitis without hematuria  Acute non-recurrent maxillary sinusitis     Discharge Instructions     Or to take your allergy medication every day, including your intranasal steroid (Flonase, Nasacort). Return for worsening facial pain, cough, shortness of breath, fever.    ED Prescriptions    Medication Sig Dispense Auth. Provider   amoxicillin-clavulanate (AUGMENTIN) 875-125  MG tablet Take 1 tablet by mouth every 12 (twelve) hours. 14 tablet Hall-Potvin, [place], PA-C   fluconazole (DIFLUCAN) 200 MG tablet Take 1 tablet (200 mg total) by mouth once for 1 dose. May repeat in 72 hours if needed 2 tablet  Hall-Potvin, [place], PA-C     PDMP not reviewed this encounter.   Hall-Potvin, [place], [place] 09/24/19 1722

## 2019-09-25 LAB — URINE CULTURE: Culture: <10000 — AB

## 2021-02-02 NOTE — Progress Notes (Signed)
PCP:  Patient, No Pcp Per   Chief Complaint  Patient presents with  . Gynecologic Exam    No concerns     HPI:      [name] is a 40 y.o. G0P0000 who LMP was Patient's last menstrual period was 01/05/2021 (approximate)., presents today for her annual examination.  Her menses are regular every 28-30 days, lasting 4-7 days, mod flow.  Dysmenorrhea min. She does not have intermenstrual bleeding.  Sex activity: single partner, contraception - none, declines BC. Occas with vag dryness improved with lubricants.  Last Pap: 08/13/19  Results were: no abnormalities /neg HPV DNA. Repeat in 2023. Hx of STDs: HSV (no meds needed), HPV on pap in distant past Hx of inner thigh abscesses as well as psoriasis. Pt sees derm.   Last mammo: N/A due to age There is no FH of breast cancer. There is no FH of ovarian cancer. The patient does do self-breast exams.  Tobacco use: 1/2 ppd, down from 1 ppd Alcohol use: 1-2 drinks nightly No drug use.  Exercise: moderately active  She does not get adequate calcium and Vitamin D in her diet.  Labs with PCP  Past Medical History:  Diagnosis Date  . Absence of menstruation   . Acute bronchitis   . Anal fissure   . BV (bacterial vaginosis)    recurrent  . Cellulitis and abscess of unspecified site   . Dysphagia, unspecified(787.20)   . Genital herpes, unspecified   . Other acute sinusitis   . Other psoriasis   . Screening for lipoid disorders   . Tobacco use disorder     History reviewed. No pertinent surgical history.  Family History  Problem Relation Age of Onset  . Hypertension Father   . Diabetes Father        Type 2  . Cancer Father        blood  . Hypertension Mother   . Colon cancer Maternal Grandfather     Social History   Socioeconomic History  . Marital status: Married    Spouse name: Not on file  . Number of children: Not on file  . Years of education: Not on file  . Highest education level: Not on file   Occupational History  . Occupation: in Armed forces technical officer for [employer]  Tobacco Use  . Smoking status: Current Every Day Smoker    Packs/day: 0.50    Types: Cigarettes  . Smokeless tobacco: Never Used  . Tobacco comment: has the chantix but hasn't committed to stopping smoking yet  Vaping Use  . Vaping Use: Never used  Substance and Sexual Activity  . Alcohol use: Yes    Alcohol/week: 0.0 standard drinks    Comment: Occasional  . Drug use: No  . Sexual activity: Yes    Birth control/protection: None  Other Topics Concern  . Not on file  Social History Narrative   Relocated from [place] 3 years ago. Married in 10/10. Lives in [place].   Social Determinants of Health   Financial Resource Strain: Not on file  Food Insecurity: Not on file  Transportation Needs: Not on file  Physical Activity: Not on file  Stress: Not on file  Social Connections: Not on file  Intimate Partner Violence: Not on file    Outpatient Medications Prior to Visit  Medication Sig Dispense Refill  . albuterol (VENTOLIN HFA) 108 (90 Base) MCG/ACT inhaler INHALE 2 PUFFS BY MOUTH EVERY 6 HOURS AS NEEDED 8.5 g 2  .  aspirin-acetaminophen-caffeine (EXCEDRIN MIGRAINE) 250-250-65 MG per tablet Take 2-3 tablets by mouth every 6 (six) hours as needed for headache.    . cetirizine (ZYRTEC) 10 MG tablet Take 10 mg by mouth daily.    . diclofenac (VOLTAREN) 50 MG EC tablet Take 50 mg by mouth 2 (two) times daily.    . ENSTILAR 0.005-0.064 % FOAM apply to skin BID    . fluocinonide (LIDEX) 0.05 % external solution APPLY TO THE SCALP NIGHTLY AS NEEDED FOR PSORIASIS    . fluticasone (FLONASE) 50 MCG/ACT nasal spray Place 2 sprays into both nostrils daily.    Marland Kitchen HUMIRA PEN 40 MG/0.8ML PNKT     . omeprazole (PRILOSEC) 20 MG capsule Take 1 capsule (20 mg total) by mouth daily as needed. 30 capsule 0  . WYNZORA 0.005-0.064 % CREA apply to affected areas BID x 8 weeks    . amoxicillin-clavulanate (AUGMENTIN) 875-125 MG tablet  Take 1 tablet by mouth every 12 (twelve) hours. 14 tablet 0  . doxycycline (VIBRA-TABS) 100 MG tablet Take 1 tablet (100 mg total) by mouth 2 (two) times daily. 20 tablet 0   No facility-administered medications prior to visit.      ROS:  Review of Systems  Constitutional: Negative for fatigue, fever and unexpected weight change.  Respiratory: Negative for cough, shortness of breath and wheezing.   Cardiovascular: Negative for chest pain, palpitations and leg swelling.  Gastrointestinal: Negative for blood in stool, constipation, diarrhea, nausea and vomiting.  Endocrine: Negative for cold intolerance, heat intolerance and polyuria.  Genitourinary: Positive for vaginal discharge. Negative for dyspareunia, dysuria, flank pain, frequency, genital sores, hematuria, menstrual problem, pelvic pain, urgency, vaginal bleeding and vaginal pain.  Musculoskeletal: Negative for back pain, joint swelling and myalgias.  Skin: Positive for rash and wound.  Neurological: Negative for dizziness, syncope, light-headedness, numbness and headaches.  Hematological: Negative for adenopathy.  Psychiatric/Behavioral: Negative for agitation, confusion, sleep disturbance and suicidal ideas. The patient is not nervous/anxious.    BREAST: No symptoms   Objective: BP 130/70   Ht 5\' 9"  (1.753 m)   Wt 223 lb (101.2 kg)   LMP 01/05/2021 (Approximate)   BMI 32.93 kg/m    Physical Exam Constitutional:      Appearance: She is well-developed.  Genitourinary:     Vulva normal.     Right Labia: No rash, tenderness or lesions.    Left Labia: No tenderness, lesions or rash.    Vaginal bleeding present.     No vaginal discharge, erythema or tenderness.      Right Adnexa: not tender and no mass present.    Left Adnexa: not tender and no mass present.    No cervical friability or polyp.     Uterus is not enlarged or tender.  Breasts:     Right: No mass, nipple discharge, skin change or tenderness.     Left:  No mass, nipple discharge, skin change or tenderness.    Neck:     Thyroid: No thyromegaly.  Cardiovascular:     Rate and Rhythm: Normal rate and regular rhythm.     Heart sounds: Normal heart sounds. No murmur heard.   Pulmonary:     Effort: Pulmonary effort is normal.     Breath sounds: Normal breath sounds.  Abdominal:     Palpations: Abdomen is soft.     Tenderness: There is no abdominal tenderness. There is no guarding or rebound.  Musculoskeletal:        General: Normal  range of motion.     Cervical back: Normal range of motion.  Lymphadenopathy:     Cervical: No cervical adenopathy.  Neurological:     General: No focal deficit present.     Mental Status: She is alert and oriented to person, place, and time.     Cranial Nerves: No cranial nerve deficit.  Skin:    General: Skin is warm and dry.  Psychiatric:        Mood and Affect: Mood normal.        Behavior: Behavior normal.        Thought Content: Thought content normal.        Judgment: Judgment normal.  Vitals reviewed.     Assessment/Plan: Encounter for annual routine gynecological examination  Encounter for screening mammogram for malignant neoplasm of breast - Plan: MM 3D SCREEN BREAST BILATERAL; pt to sched mammo            GYN counsel adequate intake of calcium and vitamin D, diet and exercise, tobacco cessation     F/U  Return in about 1 year (around 02/03/2022).  Ante Arredondo B. Antwione Picotte, PA-C 02/03/2021 10:57 AM

## 2021-02-03 ENCOUNTER — Other Ambulatory Visit: Payer: Self-pay

## 2021-02-03 ENCOUNTER — Ambulatory Visit (INDEPENDENT_AMBULATORY_CARE_PROVIDER_SITE_OTHER): Payer: Managed Care, Other (non HMO) | Admitting: Obstetrics and Gynecology

## 2021-02-03 ENCOUNTER — Encounter: Payer: Self-pay | Admitting: Obstetrics and Gynecology

## 2021-02-03 VITALS — BP 130/70 | Ht 69.0 in | Wt 223.0 lb

## 2021-02-03 DIAGNOSIS — Z01419 Encounter for gynecological examination (general) (routine) without abnormal findings: Secondary | ICD-10-CM | POA: Diagnosis not present

## 2021-02-03 DIAGNOSIS — Z1231 Encounter for screening mammogram for malignant neoplasm of breast: Secondary | ICD-10-CM | POA: Diagnosis not present

## 2021-02-03 NOTE — Patient Instructions (Signed)
I value your feedback and you entrusting us with your care. If you get a [facility] patient survey, I would appreciate you taking the time to let us know about your experience today. Thank you!  [facility]: [phone]  [facility]: [phone]    

## 2021-12-25 ENCOUNTER — Other Ambulatory Visit: Payer: Self-pay

## 2021-12-25 ENCOUNTER — Other Ambulatory Visit: Payer: Self-pay | Admitting: Family Medicine

## 2021-12-25 ENCOUNTER — Ambulatory Visit
Admission: RE | Admit: 2021-12-25 | Discharge: 2021-12-25 | Disposition: A | Discharge status: Home / Self Care | Payer: Managed Care, Other (non HMO) | Source: Ambulatory Visit | Attending: Family Medicine | Admitting: Family Medicine

## 2021-12-25 DIAGNOSIS — M79672 Pain in left foot: Secondary | ICD-10-CM

## 2022-06-15 ENCOUNTER — Ambulatory Visit
Admission: EM | Admit: 2022-06-15 | Discharge: 2022-06-15 | Disposition: A | Discharge status: Home / Self Care | Payer: Managed Care, Other (non HMO) | Attending: Internal Medicine | Admitting: Internal Medicine

## 2022-06-15 DIAGNOSIS — R053 Chronic cough: Secondary | ICD-10-CM | POA: Diagnosis not present

## 2022-06-15 DIAGNOSIS — J069 Acute upper respiratory infection, unspecified: Secondary | ICD-10-CM | POA: Diagnosis not present

## 2022-06-15 DIAGNOSIS — J209 Acute bronchitis, unspecified: Secondary | ICD-10-CM | POA: Diagnosis not present

## 2022-06-15 MED ORDER — PREDNISONE 10 MG (21) PO TBPK: ORAL_TABLET | Freq: Every day | ORAL | 0 refills | Status: AC

## 2022-07-22 ENCOUNTER — Other Ambulatory Visit (HOSPITAL_COMMUNITY)
Admission: RE | Admit: 2022-07-22 | Discharge: 2022-07-22 | Disposition: A | Discharge status: Home / Self Care | Payer: Managed Care, Other (non HMO) | Source: Ambulatory Visit | Attending: Nurse Practitioner | Admitting: Nurse Practitioner

## 2022-07-22 DIAGNOSIS — Z124 Encounter for screening for malignant neoplasm of cervix: Secondary | ICD-10-CM | POA: Diagnosis not present

## 2022-07-26 LAB — CYTOLOGY - PAP
Comment: NEGATIVE
Diagnosis: NEGATIVE
High risk HPV: NEGATIVE

## 2022-10-12 ENCOUNTER — Ambulatory Visit: Payer: Managed Care, Other (non HMO) | Admitting: Neurology

## 2022-10-12 ENCOUNTER — Telehealth: Payer: Self-pay | Admitting: Neurology

## 2022-10-12 NOTE — Telephone Encounter (Signed)
Pt called. Said she couldn't make her appointment for today.

## 2023-10-26 ENCOUNTER — Ambulatory Visit: Payer: Managed Care, Other (non HMO) | Admitting: Podiatry

## 2023-10-27 ENCOUNTER — Encounter: Payer: Self-pay | Admitting: Podiatry

## 2023-10-27 ENCOUNTER — Ambulatory Visit (INDEPENDENT_AMBULATORY_CARE_PROVIDER_SITE_OTHER): Payer: Managed Care, Other (non HMO)

## 2023-10-27 ENCOUNTER — Ambulatory Visit: Payer: Managed Care, Other (non HMO) | Admitting: Podiatry

## 2023-10-27 VITALS — Ht 69.0 in | Wt 223.0 lb

## 2023-10-27 DIAGNOSIS — M899 Disorder of bone, unspecified: Secondary | ICD-10-CM

## 2023-10-30 NOTE — Progress Notes (Signed)
Subjective:   Patient ID: Beverly Moore, female   DOB: 42 y.o.   MRN: [id number]   HPI Patient presents stating that she had a toenail removed a number of years ago and is having pain at the end of her toe which makes shoe gear difficult.  Patient does not smoke likes to be active   Review of Systems  All other systems reviewed and are negative.       Objective:  Physical Exam Vitals and nursing note reviewed.  Constitutional:      Appearance: She is well-developed.  Pulmonary:     Effort: Pulmonary effort is normal.  Musculoskeletal:        General: Normal range of motion.  Skin:    General: Skin is warm.  Neurological:     Mental Status: She is alert.     Neurovascular status intact muscle strength found to be adequate range of motion within normal limits with patient noted to have a prominence on the distal portion of the left hallux not related to nail but appears to be more distal with a small keratotic lesion associated with it.  Good digital perfusion well-oriented x 3     Assessment:  Probability for excess ptotic lesion distal aspect left big toe with lesion formation associated with     Plan:  H&P reviewed discussed the possibility for bone spur which may be causing this with x-ray taken and at this point debrided the tissue applied cushioning and depending on response may need distal exostectomy.  Reappoint to reevaluate  X-rays indicate small distal spur consistent with probability for excess ptotic lesion

## 2023-11-28 ENCOUNTER — Ambulatory Visit: Payer: Managed Care, Other (non HMO) | Admitting: Podiatry

## 2023-11-28 DIAGNOSIS — L600 Ingrowing nail: Secondary | ICD-10-CM | POA: Diagnosis not present

## 2023-11-28 DIAGNOSIS — M899 Disorder of bone, unspecified: Secondary | ICD-10-CM

## 2023-11-30 NOTE — Progress Notes (Signed)
Subjective:   Patient ID: Beverly Moore, female   DOB: 42 y.o.   MRN: [id number]   HPI Patient states she is improved with diminishment of discomfort in the left dorsal big toe   ROS      Objective:  Physical Exam  Neurovascular status was found to be intact the area of prominence on the distal left hallux still present with keratotic tissue formation that is slight but it is not sore currently     Assessment:  Overall doing well with still slight keratotic tissue formation     Plan:  H&P reviewed this may still require bone resection distally and I made her aware that I debrided a slight amount of tissue and we will see how long this lasts for

## 2024-06-18 ENCOUNTER — Encounter: Payer: Self-pay | Admitting: Orthopaedic Surgery

## 2024-06-18 ENCOUNTER — Ambulatory Visit: Admitting: Orthopaedic Surgery

## 2024-06-18 DIAGNOSIS — M7712 Lateral epicondylitis, left elbow: Secondary | ICD-10-CM | POA: Diagnosis not present

## 2024-06-18 MED ORDER — LIDOCAINE HCL 1 % IJ SOLN
1.0000 mL | INTRAMUSCULAR | Status: AC | PRN
  Administered 2024-06-18: 1 mL

## 2024-06-18 MED ORDER — METHYLPREDNISOLONE ACETATE 40 MG/ML IJ SUSP
40.0000 mg | INTRAMUSCULAR | Status: AC | PRN
  Administered 2024-06-18: 40 mg

## 2024-06-18 NOTE — Progress Notes (Signed)
 The patient is a 43 year old female that I am seeing for the first time.  She points to the lateral elbow as a source of her pain.  She does have chronic bilateral carpal tunnel syndrome she states.  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  She denies any specific injury.  She is right-hand dominant.  She reports painful range of motion and strength deficits but is mainly pain over the lateral epicondylar area where she points to.  We talked about the possibility of repetitive activities that have caused this type of problem.  Examination of her left elbow she has full range of motion in the elbow is ligamentously stable.  There is no elbow joint effusion.  There is pain over the lateral epicondyle area consistent with epicondylitis.  I did show her stretching exercises I want her to try.  I offered a steroid injection of this area and she agreed to it and tolerated it well.  Also recommended Voltaren gel.  Hopefully this will subside with time.  She can always have a repeat injection in 3 months if needed.    Procedure Note  Patient: Beverly Moore             Date of Birth: 1981/01/10           MRN: [id number]             Visit Date: 06/18/2024  Procedures: Visit Diagnoses:  1. Left lateral epicondylitis     Hand/UE Inj: L elbow for lateral epicondylitis on 06/18/2024 10:41 AM Medications: 1 mL lidocaine 1 %; 40 mg methylPREDNISolone acetate 40 MG/ML

## 2024-07-11 ENCOUNTER — Ambulatory Visit: Admitting: Orthopaedic Surgery

## 2024-08-02 ENCOUNTER — Ambulatory Visit: Admitting: Orthopaedic Surgery

## 2024-08-02 ENCOUNTER — Encounter: Payer: Self-pay | Admitting: Orthopaedic Surgery

## 2024-08-02 DIAGNOSIS — M7712 Lateral epicondylitis, left elbow: Secondary | ICD-10-CM

## 2024-08-02 MED ORDER — METHYLPREDNISOLONE ACETATE 40 MG/ML IJ SUSP
40.0000 mg | INTRAMUSCULAR | Status: AC | PRN
  Administered 2024-08-02: 40 mg

## 2024-08-02 MED ORDER — LIDOCAINE HCL 1 % IJ SOLN
1.0000 mL | INTRAMUSCULAR | Status: AC | PRN
  Administered 2024-08-02: 1 mL

## 2024-08-02 NOTE — Progress Notes (Signed)
 The patient is seen in follow-up as a relates to lateral epicondylitis of the left elbow.  We did place a steroid injection in her elbow just over 6 weeks ago.  That helped quite a bit over the lateral epicondyle area.  She has recently started to have a little bit more pain.  She has worked on activity modification and stretches and tried topical anti-inflammatories.  She is not a diabetic.  She would like to consider another injection today which I think is reasonable since is just soft tissue and not a joint.  On exam she is tender over the lateral condyle of the left elbow the remainder of her exam is entirely normal.  Her signs and symptoms are consistent with lateral epicondylitis.  We did talk about activity modification and trying to avoid picking up things in a pronated position.  We did place another steroid injection around her lateral epicondyle area of the left elbow and she tolerated this very well.  I told her to try to wait till least mid-to-late October before considering another injection.    Procedure Note  Patient: Beverly Moore             Date of Birth: 07/02/1981           MRN: [id number]             Visit Date: 08/02/2024  Procedures: Visit Diagnoses:  1. Left lateral epicondylitis     Hand/UE Inj: L elbow for lateral epicondylitis on 08/02/2024 3:17 PM Medications: 1 mL lidocaine  1 %; 40 mg methylPREDNISolone  acetate 40 MG/ML

## 2024-10-22 ENCOUNTER — Encounter: Payer: Self-pay | Admitting: Radiology
